# Patient Record
Sex: Female | Born: 1935
Health system: Southern US, Community
[De-identification: ages and names within clinical notes are randomized; demographics above are authoritative.]

## PROBLEM LIST (undated history)

## (undated) DIAGNOSIS — J309 Allergic rhinitis, unspecified: Secondary | ICD-10-CM

## (undated) DIAGNOSIS — M797 Fibromyalgia: Secondary | ICD-10-CM

## (undated) DIAGNOSIS — I5030 Unspecified diastolic (congestive) heart failure: Secondary | ICD-10-CM

## (undated) DIAGNOSIS — R7301 Impaired fasting glucose: Secondary | ICD-10-CM

## (undated) DIAGNOSIS — S2220XA Unspecified fracture of sternum, initial encounter for closed fracture: Secondary | ICD-10-CM

## (undated) DIAGNOSIS — D696 Thrombocytopenia, unspecified: Secondary | ICD-10-CM

## (undated) DIAGNOSIS — S2239XA Fracture of one rib, unspecified side, initial encounter for closed fracture: Secondary | ICD-10-CM

## (undated) DIAGNOSIS — I4510 Unspecified right bundle-branch block: Secondary | ICD-10-CM

## (undated) DIAGNOSIS — E785 Hyperlipidemia, unspecified: Secondary | ICD-10-CM

## (undated) DIAGNOSIS — IMO0002 Reserved for concepts with insufficient information to code with codable children: Secondary | ICD-10-CM

## (undated) DIAGNOSIS — I2510 Atherosclerotic heart disease of native coronary artery without angina pectoris: Secondary | ICD-10-CM

## (undated) HISTORY — PX: TUBAL LIGATION: SHX77

## (undated) HISTORY — PX: TONSILLECTOMY AND ADENOIDECTOMY: SUR1326

## (undated) HISTORY — PX: APPENDECTOMY: SHX54

## (undated) HISTORY — PX: CHOLECYSTECTOMY: SHX55

---

## 1999-02-14 ENCOUNTER — Other Ambulatory Visit: Admission: RE | Admit: 1999-02-14 | Discharge: 1999-02-14 | Payer: Self-pay | Admitting: Family Medicine

## 2001-02-11 ENCOUNTER — Other Ambulatory Visit: Admission: RE | Admit: 2001-02-11 | Discharge: 2001-02-11 | Payer: Self-pay | Admitting: Family Medicine

## 2012-10-11 ENCOUNTER — Emergency Department (HOSPITAL_COMMUNITY): Payer: Medicare PPO

## 2012-10-11 ENCOUNTER — Encounter (HOSPITAL_COMMUNITY): Payer: Self-pay | Admitting: Emergency Medicine

## 2012-10-11 ENCOUNTER — Inpatient Hospital Stay (HOSPITAL_COMMUNITY)
Admission: EM | Admit: 2012-10-11 | Discharge: 2012-10-16 | DRG: 287 | Disposition: A | Discharge status: Home / Self Care | Payer: Medicare PPO | Attending: Cardiology | Admitting: Cardiology

## 2012-10-11 DIAGNOSIS — Z79899 Other long term (current) drug therapy: Secondary | ICD-10-CM

## 2012-10-11 DIAGNOSIS — R001 Bradycardia, unspecified: Secondary | ICD-10-CM

## 2012-10-11 DIAGNOSIS — I208 Other forms of angina pectoris: Secondary | ICD-10-CM

## 2012-10-11 DIAGNOSIS — I2089 Other forms of angina pectoris: Secondary | ICD-10-CM

## 2012-10-11 DIAGNOSIS — I359 Nonrheumatic aortic valve disorder, unspecified: Secondary | ICD-10-CM | POA: Diagnosis present

## 2012-10-11 DIAGNOSIS — I2510 Atherosclerotic heart disease of native coronary artery without angina pectoris: Secondary | ICD-10-CM | POA: Diagnosis present

## 2012-10-11 DIAGNOSIS — J309 Allergic rhinitis, unspecified: Secondary | ICD-10-CM | POA: Diagnosis present

## 2012-10-11 DIAGNOSIS — IMO0001 Reserved for inherently not codable concepts without codable children: Secondary | ICD-10-CM | POA: Diagnosis present

## 2012-10-11 DIAGNOSIS — I4510 Unspecified right bundle-branch block: Secondary | ICD-10-CM | POA: Diagnosis present

## 2012-10-11 DIAGNOSIS — R7301 Impaired fasting glucose: Secondary | ICD-10-CM | POA: Diagnosis present

## 2012-10-11 DIAGNOSIS — I509 Heart failure, unspecified: Secondary | ICD-10-CM | POA: Diagnosis present

## 2012-10-11 DIAGNOSIS — E785 Hyperlipidemia, unspecified: Secondary | ICD-10-CM | POA: Diagnosis present

## 2012-10-11 DIAGNOSIS — I440 Atrioventricular block, first degree: Secondary | ICD-10-CM | POA: Diagnosis present

## 2012-10-11 DIAGNOSIS — R079 Chest pain, unspecified: Secondary | ICD-10-CM | POA: Diagnosis present

## 2012-10-11 DIAGNOSIS — Z7982 Long term (current) use of aspirin: Secondary | ICD-10-CM

## 2012-10-11 DIAGNOSIS — I495 Sick sinus syndrome: Secondary | ICD-10-CM | POA: Diagnosis present

## 2012-10-11 DIAGNOSIS — I5030 Unspecified diastolic (congestive) heart failure: Secondary | ICD-10-CM | POA: Diagnosis present

## 2012-10-11 DIAGNOSIS — D696 Thrombocytopenia, unspecified: Secondary | ICD-10-CM | POA: Diagnosis present

## 2012-10-11 DIAGNOSIS — I5031 Acute diastolic (congestive) heart failure: Principal | ICD-10-CM | POA: Diagnosis present

## 2012-10-11 DIAGNOSIS — I249 Acute ischemic heart disease, unspecified: Secondary | ICD-10-CM

## 2012-10-11 HISTORY — DX: Fibromyalgia: M79.7

## 2012-10-11 HISTORY — DX: Unspecified fracture of sternum, initial encounter for closed fracture: S22.20XA

## 2012-10-11 HISTORY — DX: Allergic rhinitis, unspecified: J30.9

## 2012-10-11 HISTORY — DX: Atherosclerotic heart disease of native coronary artery without angina pectoris: I25.10

## 2012-10-11 HISTORY — DX: Unspecified diastolic (congestive) heart failure: I50.30

## 2012-10-11 HISTORY — DX: Fracture of one rib, unspecified side, initial encounter for closed fracture: S22.39XA

## 2012-10-11 HISTORY — DX: Unspecified right bundle-branch block: I45.10

## 2012-10-11 HISTORY — DX: Hyperlipidemia, unspecified: E78.5

## 2012-10-11 HISTORY — DX: Impaired fasting glucose: R73.01

## 2012-10-11 HISTORY — DX: Thrombocytopenia, unspecified: D69.6

## 2012-10-11 HISTORY — DX: Reserved for concepts with insufficient information to code with codable children: IMO0002

## 2012-10-11 LAB — CBC
HCT: 41.2 % (ref 36.0–46.0)
MCV: 88.6 fL (ref 78.0–100.0)
RBC: 4.65 MIL/uL (ref 3.87–5.11)
RDW: 13.1 % (ref 11.5–15.5)
WBC: 5.5 10*3/uL (ref 4.0–10.5)

## 2012-10-11 LAB — COMPREHENSIVE METABOLIC PANEL
BUN: 20 mg/dL (ref 6–23)
CO2: 25 mEq/L (ref 19–32)
Chloride: 101 mEq/L (ref 96–112)
Creatinine, Ser: 0.8 mg/dL (ref 0.50–1.10)
GFR calc Af Amer: 78 mL/min — ABNORMAL LOW (ref >90)
GFR calc non Af Amer: 67 mL/min — ABNORMAL LOW (ref >90)
Total Bilirubin: 0.6 mg/dL (ref 0.3–1.2)

## 2012-10-11 LAB — URINALYSIS, ROUTINE W REFLEX MICROSCOPIC
Protein, ur: NEGATIVE mg/dL
Urobilinogen, UA: 1 mg/dL (ref 0.0–1.0)

## 2012-10-11 LAB — POCT I-STAT TROPONIN I: Troponin i, poc: 0 ng/mL (ref 0.00–0.08)

## 2012-10-11 LAB — URINE MICROSCOPIC-ADD ON

## 2012-10-11 MED ORDER — VITAMIN D3 25 MCG (1000 UNIT) PO TABS
1000.0000 [IU] | ORAL_TABLET | Freq: Every day | ORAL | Status: DC
  Administered 2012-10-12 – 2012-10-16 (×5): 1000 [IU] via ORAL
  Filled 2012-10-11 (×6): qty 1

## 2012-10-11 MED ORDER — NITROGLYCERIN 2 % TD OINT: 1.0000 [in_us] | TOPICAL_OINTMENT | Freq: Three times a day (TID) | TRANSDERMAL | Status: DC

## 2012-10-11 MED ORDER — TIZANIDINE HCL 4 MG PO TABS
4.0000 mg | ORAL_TABLET | Freq: Three times a day (TID) | ORAL | Status: DC | PRN
  Filled 2012-10-11: qty 1

## 2012-10-11 MED ORDER — SODIUM CHLORIDE 0.9 % IV SOLN: INTRAVENOUS | Status: DC

## 2012-10-11 MED ORDER — SODIUM CHLORIDE 0.9 % IJ SOLN: 3.0000 mL | INTRAMUSCULAR | Status: DC | PRN

## 2012-10-11 MED ORDER — HEPARIN (PORCINE) IN NACL 100-0.45 UNIT/ML-% IJ SOLN
950.0000 [IU]/h | INTRAMUSCULAR | Status: DC
  Administered 2012-10-11: 1000 [IU]/h via INTRAVENOUS
  Administered 2012-10-12 – 2012-10-14 (×3): 950 [IU]/h via INTRAVENOUS
  Filled 2012-10-11 (×5): qty 250

## 2012-10-11 MED ORDER — ALPRAZOLAM 0.25 MG PO TABS: 0.2500 mg | ORAL_TABLET | Freq: Two times a day (BID) | ORAL | Status: DC | PRN

## 2012-10-11 MED ORDER — ONDANSETRON HCL 4 MG/2ML IJ SOLN: 4.0000 mg | Freq: Four times a day (QID) | INTRAMUSCULAR | Status: DC | PRN

## 2012-10-11 MED ORDER — ZOLPIDEM TARTRATE 5 MG PO TABS
5.0000 mg | ORAL_TABLET | Freq: Every evening | ORAL | Status: DC | PRN
Start: 2012-10-11 — End: 2012-10-16

## 2012-10-11 MED ORDER — METOPROLOL TARTRATE 25 MG PO TABS
25.0000 mg | ORAL_TABLET | Freq: Two times a day (BID) | ORAL | Status: DC
  Administered 2012-10-11: 25 mg via ORAL
  Filled 2012-10-11 (×3): qty 1

## 2012-10-11 MED ORDER — SODIUM CHLORIDE 0.9 % IJ SOLN: 3.0000 mL | Freq: Two times a day (BID) | INTRAMUSCULAR | Status: DC

## 2012-10-11 MED ORDER — ADULT MULTIVITAMIN W/MINERALS CH
1.0000 | ORAL_TABLET | Freq: Every day | ORAL | Status: DC
  Administered 2012-10-12 – 2012-10-16 (×5): 1 via ORAL
  Filled 2012-10-11 (×6): qty 1

## 2012-10-11 MED ORDER — NIFEDIPINE ER 30 MG PO TB24
30.0000 mg | ORAL_TABLET | Freq: Every day | ORAL | Status: DC
  Filled 2012-10-11 (×2): qty 1

## 2012-10-11 MED ORDER — NITROGLYCERIN 0.4 MG SL SUBL: 0.4000 mg | SUBLINGUAL_TABLET | SUBLINGUAL | Status: DC | PRN

## 2012-10-11 MED ORDER — ACETAMINOPHEN 325 MG PO TABS: 650.0000 mg | ORAL_TABLET | ORAL | Status: DC | PRN

## 2012-10-11 MED ORDER — VENLAFAXINE HCL ER 150 MG PO CP24
150.0000 mg | ORAL_CAPSULE | Freq: Every day | ORAL | Status: DC
  Administered 2012-10-11 – 2012-10-16 (×6): 150 mg via ORAL
  Filled 2012-10-11 (×7): qty 1

## 2012-10-11 MED ORDER — HEPARIN BOLUS VIA INFUSION
4000.0000 [IU] | Freq: Once | INTRAVENOUS | Status: AC
  Administered 2012-10-11: 4000 [IU] via INTRAVENOUS

## 2012-10-11 MED ORDER — ASPIRIN 81 MG PO CHEW
81.0000 mg | CHEWABLE_TABLET | Freq: Once | ORAL | Status: DC
  Filled 2012-10-11: qty 1

## 2012-10-11 MED ORDER — SODIUM CHLORIDE 0.9 % IV SOLN
250.0000 mL | INTRAVENOUS | Status: DC | PRN
  Administered 2012-10-13 – 2012-10-14 (×2): 250 mL via INTRAVENOUS

## 2012-10-11 NOTE — H&P (Signed)
CARDIOLOGY ADMISSION NOTE  Patient ID: Katherine Long MRN: [id number] DOB/AGE: 77-Aug-1930 77 y.o.  Admit date: 10/11/2012 Primary Physician   PROCHNAU,CAROLINE, MD Primary Cardiologist   None Chief Complaint    Chest pain  HPI:  The patient presents for evaluation of chest pain. He has no prior cardiac history other then palpitations for which he has worn monitor in the past.   She apparently had a stress test in the past as well as normal.  She presents with a one-week history of chest discomfort. This is occurring with exertion. She walks short distance and she will begin half dyspnea followed by chest heaviness. This has been severe. With this she will get nauseated and have some diaphoresis. She's not had any jaw discomfort. She has to stop what she is doing and it'll go away in 5-10 minutes. Because this was consistent happening with any activity this week she finally presented for evaluation. She has never had this before. She is not having any resting symptoms such as shortness of breath, PND or orthopnea. She's not noticing any palpitations, presyncope or syncope. She otherwise has been active.    Past Medical History  Diagnosis Date  . Fibromyalgia   . Hyperlipidemia   . Impaired fasting glucose   . Sternal fracture   . Vertebral fracture, closed   . Rib fracture   . Allergic rhinitis     Past Surgical History  Procedure Laterality Date  . Appendectomy    . Cholecystectomy    . Tubal ligation    . Tonsillectomy and adenoidectomy      Allergies  Allergen Reactions  . Penicillins    Prior to Admission medications   Medication Sig Start Date End Date Taking? Authorizing Provider  aspirin 81 MG tablet Take 324 mg by mouth once.   Yes Historical Provider, MD  cholecalciferol (VITAMIN D) 1000 UNITS tablet Take 1,000 Units by mouth daily.   Yes Historical Provider, MD  Multiple Vitamin (MULTIVITAMIN WITH MINERALS) TABS Take 1 tablet by mouth daily.   Yes Historical  Provider, MD  NIFEdipine (PROCARDIA-XL/ADALAT CC) 30 MG 24 hr tablet Take 30 mg by mouth daily.   Yes Historical Provider, MD  oxymetazoline (AFRIN) 0.05 % nasal spray Place 2 sprays into the nose 2 (two) times daily.   Yes Historical Provider, MD  tiZANidine (ZANAFLEX) 4 MG tablet Take 4 mg by mouth every 8 (eight) hours as needed (for muscle pain).   Yes Historical Provider, MD  venlafaxine XR (EFFEXOR-XR) 150 MG 24 hr capsule Take 150 mg by mouth daily.   Yes Historical Provider, MD     History   Social History  . Marital Status: Married    Spouse Name: N/A    Number of Children: 2  . Years of Education: N/A   Occupational History  . Not on file.   Social History Main Topics  . Smoking status: Never Smoker   . Smokeless tobacco: Not on file  . Alcohol Use: No  . Drug Use: No  . Sexually Active: Not on file   Other Topics Concern  . Not on file   Social History Narrative   Lives with husband.            Family History  Problem Relation Age of Onset  . Arrhythmia Mother   . CAD Father 26    Died age 53  . CAD Brother 51  . Cancer Brother     Lung  . Cancer Mother  Kidney    ROS:  As stated in the HPI and negative for all other systems.  Physical Exam: Blood pressure 126/68, pulse 78, temperature 98.2 F (36.8 C), temperature source Oral, resp. rate 16, height 5\' 4"  (1.626 m), weight 167 lb (75.751 kg), SpO2 95.00%.  GENERAL:  Well appearing HEENT:  Pupils equal round and reactive, fundi not visualized, oral mucosa unremarkable NECK:  No jugular venous distention, waveform within normal limits, carotid upstroke brisk and symmetric, no bruits, no thyromegaly LYMPHATICS:  No cervical, inguinal adenopathy LUNGS:  Clear to auscultation bilaterally BACK:  No CVA tenderness CHEST:  Unremarkable HEART:  PMI not displaced or sustained,S1 and S2 within normal limits, no S3, no S4, no clicks, no rubs, right upper sternal border brief apical systolic, diastolic  murmurs ABD:  Flat, positive bowel sounds normal in frequency in pitch, no bruits, no rebound, no guarding, no midline pulsatile mass, no hepatomegaly, no splenomegaly EXT:  2 plus pulses throughout, no edema, no cyanosis no clubbing SKIN:  No rashes no nodules NEURO:  Cranial nerves II through XII grossly intact, motor grossly intact throughout PSYCH:  Cognitively intact, oriented to person place and time  Labs: Lab Results  Component Value Date   BUN 20 10/11/2012   Lab Results  Component Value Date   CREATININE 0.80 10/11/2012   Lab Results  Component Value Date   NA 138 10/11/2012   K 4.0 10/11/2012   CL 101 10/11/2012   CO2 25 10/11/2012   No results found for this basename: CKTOTAL,  CKMB,  CKMBINDEX,  TROPONINI   Lab Results  Component Value Date   WBC 5.5 10/11/2012   HGB 13.7 10/11/2012   HCT 41.2 10/11/2012   MCV 88.6 10/11/2012   PLT 125* 10/11/2012   No results found for this basename: CHOL,  HDL,  LDLCALC,  LDLDIRECT,  TRIG,  CHOLHDL   Lab Results  Component Value Date   ALT 14 10/11/2012   AST 18 10/11/2012   ALKPHOS 102 10/11/2012   BILITOT 0.6 10/11/2012     Radiology:  CXR:  Cardiomegaly. No acute cardiopulmonary disease. Mild changes of  what is likely chronic bronchitis and/or asthma.  EKG:  NSR, RBBB, rate 68, first degree AV block.  No acute ST T wave changes. 10/11/2012  ASSESSMENT AND PLAN:    Chest pain:  This is consistent with new onset Class III - IV angina.  She will need to be admitted to the hospital. We will cycle cardiac enzymes. She'll be treated with topical nitrates, aspirin and IV heparin. I will start a low-dose beta blocker. She will need cardiac catheterization. The patient understands that risks included but are not limited to stroke (1 in 1000), death (1 in 1000), kidney failure [usually temporary] (1 in 500), bleeding (1 in 200), allergic reaction [possibly serious] (1 in 200).  The patient understands and agrees to proceed.   RBBB:  I  have no old EKG for comparison. Evaluation is as above. She has had no symptomatic bradycardia arrhythmias.  Dyslipidemia:  This has been mild and diet controlled. We will check a lipid profile in the morning.  Hyperglycemia:  I will check a hemoglobin A1c.  Murmur:  I will check an echocardiogram  Signed: Rollene Rotunda 10/11/2012, 4:18 PM

## 2012-10-11 NOTE — ED Notes (Signed)
Pt c/o mid chest pain with shortness of breath, nausea, diaphoresis and dizziness. Pt reports pain with exertion and relief with rest. Pt was seen at urgent care and sent here for further eval. Pt was given 4 baby asa.

## 2012-10-11 NOTE — ED Provider Notes (Signed)
79 y.o. Female with exertional chest pain for a two days.  Associated with dyspnea, diaphoresis, and lightheaded.Marland Kitchen  Resolves with rest. No vomiting, fever, cough, slight headache.    HR 73, hr 74, bp 129/89  Lungs cta, abdomen soft nontender Heart- regular rate and rhythm, no murmurs or gallops,  Extremities with good pulses, no edema.  Exertional chest pain for two days, abnormal ekg.  Plan admission for further cardiac evaluation.  Results for orders placed during the hospital encounter of 10/11/12  PRO B NATRIURETIC PEPTIDE      Result Value Range   Pro B Natriuretic peptide (BNP) 105.1  0 - 450 pg/mL  CBC      Result Value Range   WBC 5.5  4.0 - 10.5 K/uL   RBC 4.65  3.87 - 5.11 MIL/uL   Hemoglobin 13.7  12.0 - 15.0 g/dL   HCT 16.1  09.6 - 04.5 %   MCV 88.6  78.0 - 100.0 fL   MCH 29.5  26.0 - 34.0 pg   MCHC 33.3  30.0 - 36.0 g/dL   RDW 40.9  81.1 - 91.4 %   Platelets 125 (*) 150 - 400 K/uL  COMPREHENSIVE METABOLIC PANEL      Result Value Range   Sodium 138  135 - 145 mEq/L   Potassium 4.0  3.5 - 5.1 mEq/L   Chloride 101  96 - 112 mEq/L   CO2 25  19 - 32 mEq/L   Glucose, Bld 91  70 - 99 mg/dL   BUN 20  6 - 23 mg/dL   Creatinine, Ser 7.82  0.50 - 1.10 mg/dL   Calcium 9.5  8.4 - 95.6 mg/dL   Total Protein 7.0  6.0 - 8.3 g/dL   Albumin 3.6  3.5 - 5.2 g/dL   AST 18  0 - 37 U/L   ALT 14  0 - 35 U/L   Alkaline Phosphatase 102  39 - 117 U/L   Total Bilirubin 0.6  0.3 - 1.2 mg/dL   GFR calc non Af Amer 67 (*) >90 mL/min   GFR calc Af Amer 78 (*) >90 mL/min  POCT I-STAT TROPONIN I      Result Value Range   Troponin i, poc 0.00  0.00 - 0.08 ng/mL   Comment 3             I performed a history and physical examination of Rosebud Poles and discussed her management with Dr. Berline Chough.  I agree with the history, physical, assessment, and plan of care, with the following exceptions: None  I was present for the following procedures: None Time Spent in Critical Care of the patient:  None Time spent in discussions with the patient and family: 77  Shandel Busic S    Hilario Quarry, MD 10/11/12 1401

## 2012-10-11 NOTE — ED Provider Notes (Signed)
History    CSN: [id number] Arrival date & time 10/11/12  1111  None    Chief Complaint  Patient presents with  . Chest Pain   HPI Comments: Patient presents to the emergency department upon request of her primary care physician who she saw earlier today.  She reports a three-day onset of progressive chest pressure and chest pain with exertion.  She reports diaphoresis and nausea when she exerts herself.  She has no pain at rest.she describes the discomfort as a chest pressure that progresses into a left-sided chest pain (nonradiating) if she continues to walk.  She reports she has had this on occasion over the past couple of years and has seen a cardiologist (in [place], [place]) for this and and was told everything was normal.  She was given 324 mg aspirin at her PCPs office.  Has not received any nitroglycerin.  Denies any pain at this time.  She has no history of coronary artery diseaseor hypertension.  She does report diet controlled hyperlipidemia and impaired fasting glucose.  She has never been a smoker.  She does have a positive family history for MI in her mother but not at an early age.  Currently lives independently.  Patient is a 79 y.o. female presenting with chest pain.  Chest Pain Associated symptoms: diaphoresis, fatigue, nausea and shortness of breath   Associated symptoms: no abdominal pain, no cough, no dizziness, no fever, no headache, no palpitations, not vomiting and no weakness     Past Medical History  Diagnosis Date  . Fibromyalgia   . Hyperlipidemia     diet controlled  . Impaired fasting glucose     diet controlled  . Sternal fracture     due to fall off porch  . Vertebral fracture, closed     due to fall off porch  . Rib fracture     due to fall off porch  . Allergic rhinitis     Past Surgical History  Procedure Laterality Date  . Appendectomy    . Cholecystectomy    . Tubal ligation      Family History  Problem Relation Age of Onset  . Heart attack  Mother     History  Substance Use Topics  . Smoking status: Never Smoker   . Smokeless tobacco: Not on file  . Alcohol Use: No    OB History   Grav Para Term Preterm Abortions TAB SAB Ect Mult Living                  Review of Systems  Constitutional: Positive for diaphoresis, activity change and fatigue. Negative for fever, chills, appetite change and unexpected weight change.  HENT: Negative.  Negative for neck pain.   Eyes: Negative.  Negative for visual disturbance.  Respiratory: Positive for chest tightness and shortness of breath. Negative for cough, choking, wheezing and stridor.   Cardiovascular: Positive for chest pain. Negative for palpitations and leg swelling.  Gastrointestinal: Positive for nausea. Negative for vomiting, abdominal pain, diarrhea, blood in stool and abdominal distention.  Genitourinary: Negative for dysuria, frequency, hematuria and difficulty urinating.  Musculoskeletal: Positive for arthralgias (at baseline, Fibromyalgia).  Skin: Negative for color change, pallor, rash and wound.  Allergic/Immunologic: Negative for environmental allergies and immunocompromised state.  Neurological: Positive for light-headedness (occasionally with chest pressure). Negative for dizziness, syncope, speech difficulty, weakness and headaches.  Hematological: Negative.   Psychiatric/Behavioral: Negative.     Allergies  Penicillins  Home Medications   Current Outpatient  Rx  Name  Route  Sig  Dispense  Refill  . aspirin 81 MG tablet   Oral   Take 324 mg by mouth once.         . cholecalciferol (VITAMIN D) 1000 UNITS tablet   Oral   Take 1,000 Units by mouth daily.         . Multiple Vitamin (MULTIVITAMIN WITH MINERALS) TABS   Oral   Take 1 tablet by mouth daily.         Marland Kitchen NIFEdipine (PROCARDIA-XL/ADALAT CC) 30 MG 24 hr tablet   Oral   Take 30 mg by mouth daily.         Marland Kitchen oxymetazoline (AFRIN) 0.05 % nasal spray   Nasal   Place 2 sprays into the  nose 2 (two) times daily.         Marland Kitchen tiZANidine (ZANAFLEX) 4 MG tablet   Oral   Take 4 mg by mouth every 8 (eight) hours as needed (for muscle pain).         Marland Kitchen venlafaxine XR (EFFEXOR-XR) 150 MG 24 hr capsule   Oral   Take 150 mg by mouth daily.           BP 140/82  Pulse 83  Temp(Src) 98.2 F (36.8 C) (Oral)  Resp 16  Ht 5\' 4"  (1.626 m)  Wt 167 lb (75.751 kg)  BMI 28.65 kg/m2  SpO2 96%  Physical Exam  Nursing note and vitals reviewed. Constitutional: She appears well-developed and well-nourished. No distress.  HENT:  Head: Normocephalic and atraumatic.  Eyes: Conjunctivae are normal. Right eye exhibits no discharge. Left eye exhibits no discharge. No scleral icterus.  Neck: No JVD present. No tracheal deviation present.  Cardiovascular: Normal rate, regular rhythm, S1 normal and S2 normal.  Exam reveals distant heart sounds. Exam reveals no gallop.   No murmur heard. Pulses:      Radial pulses are 1+ on the right side, and 1+ on the left side.  Trace lower extremity distal pulses.  LE Capillary refill 3-4 seconds.  Pulmonary/Chest: Effort normal and breath sounds normal. No respiratory distress. She has no wheezes. She has no rales.  Abdominal: Soft. She exhibits no distension. There is no tenderness.  Musculoskeletal: Normal range of motion. She exhibits no edema.  Neurological: She is alert. She exhibits normal muscle tone.  Skin: Skin is warm and dry. No rash noted. She is not diaphoretic. No erythema. No pallor.  Psychiatric: She has a normal mood and affect. Her behavior is normal. Judgment and thought content normal.    ED Course  Procedures (including critical care time)  Labs Reviewed  CBC - Abnormal; Notable for the following:    Platelets 125 (*)    All other components within normal limits  COMPREHENSIVE METABOLIC PANEL - Abnormal; Notable for the following:    GFR calc non Af Amer 67 (*)    GFR calc Af Amer 78 (*)    All other components within  normal limits  PRO B NATRIURETIC PEPTIDE  URINALYSIS, ROUTINE W REFLEX MICROSCOPIC  POCT I-STAT TROPONIN I   Dg Chest 2 View  10/11/2012   *RADIOLOGY REPORT*  Clinical Data: Chest pain.  Arrhythmia.  CHEST - 2 VIEW  Comparison: None.  Findings: Cardiac silhouette moderately enlarged.  Thoracic aorta tortuous and atherosclerotic.  Hilar and mediastinal contours otherwise unremarkable.  Mildly prominent bronchovascular markings diffusely and mild central peribronchial thickening.  Lungs otherwise clear.  Normal pulmonary vascularity.  No pneumothorax. No  pleural effusions.  Degenerative changes involving the thoracic spine and multiple thoracic vertebral compression fractures due to osteopenia.  Thoracolumbar scoliosis convex left.  IMPRESSION: Cardiomegaly.  No acute cardiopulmonary disease.  Mild changes of what is likely chronic bronchitis and/or asthma.   Original Report Authenticated By: Hulan Saas, M.D.      Date: 10/11/2012  Rate: 89  Rhythm: normal sinus rhythm  QRS Axis: normal  Intervals: prolonged PR interval at 250,   ST/T Wave abnormalities: T wave inversion in anterior/inferior leads  Conduction Disutrbances: RBBB  Narrative Interpretation: Abnormal EKG, no STEMI  No OLD EKG.    No diagnosis found.   MDM  1150 - patient with symptoms concerning for acutely, worsening exertional angina = unstable angina.  Denies any chest pain at this time what is reproducible with any type of exertion.  Patient has received 324 mg of aspirin.  Has evidence of right bundle branch block and first degree AV block on EKG evidence for inferior lateral ischemia with T wave inversions in V1 -V4, Lead III and aVL.  No ST changes.  No prior EKG available at this time.  ACS protocol and plan to have cardiology see.     1400 - unremarkable labs and chest x-ray.  Will contact [place] cardiology for admission to evaluate for ACS evaluation.  No active chest pain at this time.       Andrena Mews, DO 10/11/12 1414

## 2012-10-11 NOTE — ED Provider Notes (Signed)
  I performed a history and physical examination of Katherine Long and discussed her management with Dr. Berline Chough.  I agree with the history, physical, assessment, and plan of care, with the following exceptions: None  I was present for the following procedures: None Time Spent in Critical Care of the patient: None Time spent in discussions with the patient and family: 58  Bayler Nehring S    Hilario Quarry, MD 10/11/12 [phone]

## 2012-10-11 NOTE — Progress Notes (Signed)
ANTICOAGULATION CONSULT NOTE - Initial Consult  Pharmacy Consult for UFH Indication: USAP  Allergies  Allergen Reactions  . Penicillins     Patient Measurements: Height: 5\' 4"  (162.6 cm) Weight: 167 lb (75.751 kg) IBW/kg (Calculated) : 54.7 Heparin Dosing Weight: 69kg  Vital Signs: Temp: 98.2 F (36.8 C) (05/23 1120) Temp src: Oral (05/23 1120) BP: 126/68 mmHg (05/23 1500) Pulse Rate: 78 (05/23 1500)  Labs:  Recent Labs  10/11/12 1215  HGB 13.7  HCT 41.2  PLT 125*  CREATININE 0.80    Estimated Creatinine Clearance: 54.9 ml/min (by C-G formula based on Cr of 0.8).   Medical History: Past Medical History  Diagnosis Date  . Fibromyalgia   . Hyperlipidemia   . Impaired fasting glucose   . Sternal fracture   . Vertebral fracture, closed   . Rib fracture   . Allergic rhinitis     Medications:   (Not in a hospital admission)  Assessment: 79 y/o female patient admitted with chest pain requiring anticoagulation for r/o MI. Noted baseline thrombocytopenia. Trop negative, EKG with T wave inversion.  Goal of Therapy:  Heparin level 0.3-0.7 units/ml Monitor platelets by anticoagulation protocol: Yes   Plan:  Heparin 4000 unit IV bolus followed by infusion at 1000 units/hr. Check 8 hour heparin level with daily cbc and heparin level.  Verlene Mayer, PharmD, BCPS Pager [phone]/23/2014,4:16 PM

## 2012-10-12 DIAGNOSIS — I209 Angina pectoris, unspecified: Secondary | ICD-10-CM

## 2012-10-12 LAB — CBC
Hemoglobin: 11.6 g/dL — ABNORMAL LOW (ref 12.0–15.0)
MCHC: 33 g/dL (ref 30.0–36.0)
Platelets: 100 10*3/uL — ABNORMAL LOW (ref 150–400)
RDW: 13.1 % (ref 11.5–15.5)

## 2012-10-12 LAB — HEMOGLOBIN A1C: Mean Plasma Glucose: 114 mg/dL (ref <117)

## 2012-10-12 LAB — LIPID PANEL
LDL Cholesterol: 86 mg/dL (ref 0–99)
Total CHOL/HDL Ratio: 2.4 RATIO
VLDL: 12 mg/dL (ref 0–40)

## 2012-10-12 LAB — HEPARIN LEVEL (UNFRACTIONATED)
Heparin Unfractionated: 0.57 IU/mL (ref 0.30–0.70)
Heparin Unfractionated: 0.61 IU/mL (ref 0.30–0.70)

## 2012-10-12 LAB — COMPREHENSIVE METABOLIC PANEL
ALT: 12 U/L (ref 0–35)
CO2: 28 mEq/L (ref 19–32)
Calcium: 8.9 mg/dL (ref 8.4–10.5)
GFR calc Af Amer: 71 mL/min — ABNORMAL LOW (ref >90)
GFR calc non Af Amer: 62 mL/min — ABNORMAL LOW (ref >90)
Glucose, Bld: 105 mg/dL — ABNORMAL HIGH (ref 70–99)
Sodium: 143 mEq/L (ref 135–145)
Total Bilirubin: 0.5 mg/dL (ref 0.3–1.2)

## 2012-10-12 MED ORDER — SENNOSIDES-DOCUSATE SODIUM 8.6-50 MG PO TABS
1.0000 | ORAL_TABLET | Freq: Two times a day (BID) | ORAL | Status: DC | PRN
  Administered 2012-10-12: 1 via ORAL
  Filled 2012-10-12 (×4): qty 1

## 2012-10-12 MED ORDER — ISOSORBIDE DINITRATE 5 MG PO TABS
5.0000 mg | ORAL_TABLET | Freq: Three times a day (TID) | ORAL | Status: DC
  Administered 2012-10-12 – 2012-10-15 (×10): 5 mg via ORAL
  Filled 2012-10-12 (×15): qty 1

## 2012-10-12 NOTE — Progress Notes (Signed)
Patient's heart rate down to 38, sustaining in 40's all night.  Heart rate on admission 70-80's.  Patient denies any lightheadiness or dizziness.  Started on PO lopressor and dose given last pm.  Dr. Eden Emms on floor and made aware.  Will hold am lopressor and procardia.  Will continue to monitor.  Katherine Long

## 2012-10-12 NOTE — Progress Notes (Signed)
ANTICOAGULATION CONSULT NOTE - Follow Up Consult  Pharmacy Consult for heparin  Indication: [place]  Allergies  Allergen Reactions  . Penicillins     Patient Measurements: Height: 5\' 4"  (162.6 cm) Weight: 163 lb 3.2 oz (74.027 kg) IBW/kg (Calculated) : 54.7 Heparin Dosing Weight:   Vital Signs: Temp: 98.4 F (36.9 C) (05/23 2036) Temp src: Oral (05/23 2036) BP: 121/72 mmHg (05/23 2036) Pulse Rate: 75 (05/23 2036)  Labs:  Recent Labs  10/11/12 1215 10/11/12 1917 10/12/12 0025  HGB 13.7  --   --   HCT 41.2  --   --   PLT 125*  --   --   HEPARINUNFRC  --   --  0.57  CREATININE 0.80  --   --   TROPONINI  --  <0.30 <0.30    Estimated Creatinine Clearance: 54.3 ml/min (by C-G formula based on Cr of 0.8).     Assessment and Plan: 1st HL is therapeutic at 0.57 with no noted bleeding. Continue at current rate of 1000units/hr and f/u am labs Goal of Therapy:  Heparin level 0.3-0.7 units/ml Monitor platelets by anticoagulation protocol: Yes    Janice Coffin 10/12/2012,1:10 AM

## 2012-10-12 NOTE — Progress Notes (Signed)
Patient ID: Katherine Long, female   DOB: [birth year], 79 y.o.   MRN: [id number]    Subjective:  Denies SSCP, palpitations or Dyspnea Daughter indicates likely SSS Seen by [facility] cardiology before  Objective:  Filed Vitals:   10/11/12 1700 10/11/12 1731 10/11/12 2036 10/12/12 0436  BP: 128/66 130/60 121/72 134/76  Pulse: 82 84 75 84  Temp:  97.2 F (36.2 C) 98.4 F (36.9 C) 97.7 F (36.5 C)  TempSrc:   Oral Oral  Resp: 19 18 18 18   Height:  5\' 4"  (1.626 m)    Weight:  163 lb 3.2 oz (74.027 kg)  173 lb 3.2 oz (78.563 kg)  SpO2: 97% 98% 96% 95%    Intake/Output from previous day:  Intake/Output Summary (Last 24 hours) at 10/12/12 0847 Last data filed at 10/12/12 4098  Gross per 24 hour  Intake    480 ml  Output      0 ml  Net    480 ml    Physical Exam: Affect appropriate Healthy:  appears stated age HEENT: normal Neck supple with no adenopathy JVP normal no bruits no thyromegaly Lungs clear with no wheezing and good diaphragmatic motion Heart:  S1/S57midl AS murmur, no rub, gallop or click PMI normal Abdomen: benighn, BS positve, no tenderness, no AAA no bruit.  No HSM or HJR Distal pulses intact with no bruits No edema Neuro non-focal Skin warm and dry No muscular weakness   Lab Results: Basic Metabolic Panel:  Recent Labs  11/91/47 1215 10/12/12 0535  NA 138 143  K 4.0 4.9  CL 101 107  CO2 25 28  GLUCOSE 91 105*  BUN 20 19  CREATININE 0.80 0.86  CALCIUM 9.5 8.9   Liver Function Tests:  Recent Labs  10/11/12 1215 10/12/12 0535  AST 18 15  ALT 14 12  ALKPHOS 102 81  BILITOT 0.6 0.5  PROT 7.0 5.7*  ALBUMIN 3.6 3.0*   No results found for this basename: LIPASE, AMYLASE,  in the last 72 hours CBC:  Recent Labs  10/11/12 1215 10/12/12 0535  WBC 5.5 5.0  HGB 13.7 11.6*  HCT 41.2 35.2*  MCV 88.6 89.3  PLT 125* 100*   Cardiac Enzymes:  Recent Labs  10/11/12 1917 10/12/12 0025 10/12/12 0535  TROPONINI <0.30 <0.30 <0.30    Fasting Lipid Panel:  Recent Labs  10/12/12 0535  CHOL 170  HDL 72  LDLCALC 86  TRIG 58  CHOLHDL 2.4    Imaging: Dg Chest 2 View  10/11/2012   *RADIOLOGY REPORT*  Clinical Data: Chest pain.  Arrhythmia.  CHEST - 2 VIEW  Comparison: None.  Findings: Cardiac silhouette moderately enlarged.  Thoracic aorta tortuous and atherosclerotic.  Hilar and mediastinal contours otherwise unremarkable.  Mildly prominent bronchovascular markings diffusely and mild central peribronchial thickening.  Lungs otherwise clear.  Normal pulmonary vascularity.  No pneumothorax. No pleural effusions.  Degenerative changes involving the thoracic spine and multiple thoracic vertebral compression fractures due to osteopenia.  Thoracolumbar scoliosis convex left.  IMPRESSION: Cardiomegaly.  No acute cardiopulmonary disease.  Mild changes of what is likely chronic bronchitis and/or asthma.   Original Report Authenticated By: Hulan Saas, M.D.    Cardiac Studies:  ECG:  SR long PR RBBB   Telemetry:  SB rates as low as 47 no AV block  Echo:   Medications:   . aspirin  81 mg Oral Once  . cholecalciferol  1,000 Units Oral Daily  . isosorbide dinitrate  5 mg Oral TID  .  multivitamin with minerals  1 tablet Oral Daily  . nitroGLYCERIN  1 inch Topical Q8H  . sodium chloride  3 mL Intravenous Q12H  . venlafaxine XR  150 mg Oral Daily     . heparin 1,000 Units/hr (10/11/12 1638)    Assessment/Plan:  Chest Pain: See note by [facility]  Cath on Tuesday. Enzymes negative. On heparin will add nitrates.  Cannot take Beta blockers due to apparent SSS will stop loprssor and continue telemetry Murmur:  Echo appears to have mild AS  Patient wants laxative  Charlton Haws 10/12/2012, 8:47 AM

## 2012-10-12 NOTE — Plan of Care (Signed)
Problem: Phase I Progression Outcomes Goal: Aspirin unless contraindicated Outcome: Completed/Met Date Met:  10/12/12 Received 324 mg of ASA at primary MD office.

## 2012-10-12 NOTE — Progress Notes (Signed)
ANTICOAGULATION CONSULT NOTE - Follow Up Consult  Pharmacy Consult for heparin  Indication: [place]  Allergies  Allergen Reactions  . Penicillins     Patient Measurements: Height: 5\' 4"  (162.6 cm) Weight: 173 lb 3.2 oz (78.563 kg) IBW/kg (Calculated) : 54.7 Heparin Dosing Weight:   Vital Signs: Temp: 97.7 F (36.5 C) (05/24 0436) Temp src: Oral (05/24 0436) BP: 134/76 mmHg (05/24 0436) Pulse Rate: 84 (05/24 0436)  Labs:  Recent Labs  10/11/12 1215 10/11/12 1917 10/12/12 0025 10/12/12 0535  HGB 13.7  --   --  11.6*  HCT 41.2  --   --  35.2*  PLT 125*  --   --  100*  HEPARINUNFRC  --   --  0.57 0.61  CREATININE 0.80  --   --  0.86  TROPONINI  --  <0.30 <0.30 <0.30    Estimated Creatinine Clearance: 52.1 ml/min (by C-G formula based on Cr of 0.86).     Assessment and Plan: 83yof with no cardiac Hx except for remote palpitations,  presented with 1week Hx CP with exertion.  Tp negative.  Heparin drip 1000 uts/hr started 5/23 HL at goal 0.57> 0.61.  Slight trend up overnight with no rate change.  CBC stable but thrombocytopenia on admit.  Will decrease heparin drip rate to prevent accumulation in this elderly pt.   Goal of Therapy:  Heparin level 0.3-0.7 units/ml Monitor platelets by anticoagulation protocol: Yes    Plan: Decrease heparin drip 950 uts/hr Daily HL/CBC  Leota Sauers Pharm.D. CPP, BCPS Clinical Pharmacist [phone] 10/12/2012 8:04 AM

## 2012-10-13 LAB — CBC
MCHC: 32.3 g/dL (ref 30.0–36.0)
Platelets: 99 10*3/uL — ABNORMAL LOW (ref 150–400)
RDW: 13.1 % (ref 11.5–15.5)
WBC: 4.1 10*3/uL (ref 4.0–10.5)

## 2012-10-13 LAB — URINE CULTURE

## 2012-10-13 LAB — HEPARIN LEVEL (UNFRACTIONATED): Heparin Unfractionated: 0.6 IU/mL (ref 0.30–0.70)

## 2012-10-13 NOTE — Progress Notes (Signed)
ANTICOAGULATION CONSULT NOTE - Follow Up Consult  Pharmacy Consult for heparin  Indication: [place]  Allergies  Allergen Reactions  . Penicillins     Patient Measurements: Height: 5\' 4"  (162.6 cm) Weight: 171 lb 1.2 oz (77.6 kg) IBW/kg (Calculated) : 54.7 Heparin Dosing Weight:   Vital Signs: Temp: 98 F (36.7 C) (05/25 0630) Temp src: Oral (05/25 0630) BP: 109/50 mmHg (05/25 0630) Pulse Rate: 66 (05/25 0630)  Labs:  Recent Labs  10/11/12 1215 10/11/12 1917 10/12/12 0025 10/12/12 0535 10/13/12 0540  HGB 13.7  --   --  11.6* 11.5*  HCT 41.2  --   --  35.2* 35.6*  PLT 125*  --   --  100* 99*  HEPARINUNFRC  --   --  0.57 0.61 0.60  CREATININE 0.80  --   --  0.86  --   TROPONINI  --  <0.30 <0.30 <0.30  --     Estimated Creatinine Clearance: 51.8 ml/min (by C-G formula based on Cr of 0.86).     Assessment and Plan: 83yof with no cardiac Hx except for remote palpitations,  presented with 1week Hx CP with exertion.  Tp negative.  Heparin drip 950 uts/hr heparin level 0.6.  H/H  stable low pltc -but thrombocytopenia on admit.   Goal of Therapy:  Heparin level 0.3-0.7 units/ml Monitor platelets by anticoagulation protocol: Yes    Plan: Decrease heparin drip 950 uts/hr Daily HL/CBC  Leota Sauers Pharm.D. CPP, BCPS Clinical Pharmacist [phone] 10/13/2012 8:24 AM

## 2012-10-13 NOTE — Progress Notes (Signed)
Patient ID: Katherine Long, female   DOB: 18-Mar-1929, 79 y.o.   MRN: [id number]    Subjective:  Denies SSCP, palpitations or Dyspnea Daughter indicates likely SSS Seen by [facility] before  HR back up after beta blocker stopped  Objective:  Filed Vitals:   10/12/12 0436 10/12/12 1400 10/12/12 2141 10/13/12 0630  BP: 134/76 107/58 98/54 109/50  Pulse: 84 49 50 66  Temp: 97.7 F (36.5 C) 98.4 F (36.9 C) 98.5 F (36.9 C) 98 F (36.7 C)  TempSrc: Oral Oral Oral Oral  Resp: 18 18 18 18   Height:      Weight: 173 lb 3.2 oz (78.563 kg)   171 lb 1.2 oz (77.6 kg)  SpO2: 95% 96% 96% 96%    Intake/Output from previous day:  Intake/Output Summary (Last 24 hours) at 10/13/12 1660 Last data filed at 10/12/12 1900  Gross per 24 hour  Intake   1071 ml  Output      0 ml  Net   1071 ml    Physical Exam: Affect appropriate Healthy:  appears stated age HEENT: normal Neck supple with no adenopathy JVP normal no bruits no thyromegaly Lungs clear with no wheezing and good diaphragmatic motion Heart:  S1/S34midl AS murmur, no rub, gallop or click PMI normal Abdomen: benighn, BS positve, no tenderness, no AAA no bruit.  No HSM or HJR Distal pulses intact with no bruits No edema Neuro non-focal Skin warm and dry No muscular weakness   Lab Results: Basic Metabolic Panel:  Recent Labs  63/01/60 1215 10/12/12 0535  NA 138 143  K 4.0 4.9  CL 101 107  CO2 25 28  GLUCOSE 91 105*  BUN 20 19  CREATININE 0.80 0.86  CALCIUM 9.5 8.9   Liver Function Tests:  Recent Labs  10/11/12 1215 10/12/12 0535  AST 18 15  ALT 14 12  ALKPHOS 102 81  BILITOT 0.6 0.5  PROT 7.0 5.7*  ALBUMIN 3.6 3.0*   No results found for this basename: LIPASE, AMYLASE,  in the last 72 hours CBC:  Recent Labs  10/12/12 0535 10/13/12 0540  WBC 5.0 4.1  HGB 11.6* 11.5*  HCT 35.2* 35.6*  MCV 89.3 89.2  PLT 100* 99*   Cardiac Enzymes:  Recent Labs  10/11/12 1917 10/12/12 0025  10/12/12 0535  TROPONINI <0.30 <0.30 <0.30   Fasting Lipid Panel:  Recent Labs  10/12/12 0535  CHOL 170  HDL 72  LDLCALC 86  TRIG 58  CHOLHDL 2.4    Imaging: Dg Chest 2 View  10/11/2012   *RADIOLOGY REPORT*  Clinical Data: Chest pain.  Arrhythmia.  CHEST - 2 VIEW  Comparison: None.  Findings: Cardiac silhouette moderately enlarged.  Thoracic aorta tortuous and atherosclerotic.  Hilar and mediastinal contours otherwise unremarkable.  Mildly prominent bronchovascular markings diffusely and mild central peribronchial thickening.  Lungs otherwise clear.  Normal pulmonary vascularity.  No pneumothorax. No pleural effusions.  Degenerative changes involving the thoracic spine and multiple thoracic vertebral compression fractures due to osteopenia.  Thoracolumbar scoliosis convex left.  IMPRESSION: Cardiomegaly.  No acute cardiopulmonary disease.  Mild changes of what is likely chronic bronchitis and/or asthma.   Original Report Authenticated By: Hulan Saas, M.D.    Cardiac Studies:  ECG:  SR long PR RBBB   Telemetry:  SB rates as low as 47  5/24  Today SR rates 70's no pauses  Echo:   Medications:   . aspirin  81 mg Oral Once  . cholecalciferol  1,000  Units Oral Daily  . isosorbide dinitrate  5 mg Oral TID  . multivitamin with minerals  1 tablet Oral Daily  . nitroGLYCERIN  1 inch Topical Q8H  . sodium chloride  3 mL Intravenous Q12H  . venlafaxine XR  150 mg Oral Daily     . heparin 950 Units/hr (10/12/12 1144)    Assessment/Plan:  Chest Pain: See note by [facility]  Cath on Tuesday. Enzymes negative. On heparin will add nitrates.  Cannot take Beta blockers due to apparent SSS lopressor stopped  and continue telemetry Murmur:  Echo appears to have mild AS  Patient wants laxative Pre cath orders written  Charlton Haws 10/13/2012, 8:06 AM

## 2012-10-14 DIAGNOSIS — I200 Unstable angina: Secondary | ICD-10-CM

## 2012-10-14 DIAGNOSIS — I498 Other specified cardiac arrhythmias: Secondary | ICD-10-CM

## 2012-10-14 LAB — CBC
HCT: 36.6 % (ref 36.0–46.0)
Hemoglobin: 12.1 g/dL (ref 12.0–15.0)
MCHC: 33 g/dL (ref 30.0–36.0)
Platelets: 92 10*3/uL — ABNORMAL LOW (ref 150–400)
RDW: 13.3 % (ref 11.5–15.5)
RDW: 13.3 % (ref 11.5–15.5)
WBC: 4.4 10*3/uL (ref 4.0–10.5)
WBC: 3.7 10*3/uL — ABNORMAL LOW (ref 4.0–10.5)

## 2012-10-14 LAB — BASIC METABOLIC PANEL
Chloride: 106 mEq/L (ref 96–112)
GFR calc Af Amer: >90 mL/min (ref >90)
GFR calc non Af Amer: 83 mL/min — ABNORMAL LOW (ref >90)
Potassium: 3.7 mEq/L (ref 3.5–5.1)
Sodium: 143 mEq/L (ref 135–145)

## 2012-10-14 LAB — HEPARIN LEVEL (UNFRACTIONATED): Heparin Unfractionated: 0.5 IU/mL (ref 0.30–0.70)

## 2012-10-14 MED ORDER — DIAZEPAM 5 MG PO TABS
5.0000 mg | ORAL_TABLET | ORAL | Status: AC
  Administered 2012-10-15: 5 mg via ORAL
  Filled 2012-10-14: qty 1

## 2012-10-14 MED ORDER — SODIUM CHLORIDE 0.9 % IV SOLN: 250.0000 mL | INTRAVENOUS | Status: DC | PRN

## 2012-10-14 MED ORDER — SODIUM CHLORIDE 0.9 % IV SOLN
1.0000 mL/kg/h | INTRAVENOUS | Status: DC
  Administered 2012-10-15: 1 mL/kg/h via INTRAVENOUS

## 2012-10-14 MED ORDER — SODIUM CHLORIDE 0.9 % IJ SOLN: 3.0000 mL | Freq: Two times a day (BID) | INTRAMUSCULAR | Status: DC

## 2012-10-14 MED ORDER — ASPIRIN 81 MG PO CHEW
324.0000 mg | CHEWABLE_TABLET | ORAL | Status: AC
  Administered 2012-10-15: 324 mg via ORAL
  Filled 2012-10-14: qty 4

## 2012-10-14 MED ORDER — SODIUM CHLORIDE 0.9 % IJ SOLN: 3.0000 mL | INTRAMUSCULAR | Status: DC | PRN

## 2012-10-14 NOTE — Progress Notes (Signed)
Utilization review completed. Chadwick Reiswig, RN, BSN. 

## 2012-10-14 NOTE — Progress Notes (Signed)
Pt/daughter apprehensive about cath and requested more information. I explained the procedure, the process, the potential outcomes, risks, and reasons for doing the procedure. I also offered for them to watch the cath video. They were appreciative and plan to proceed as previously outlined at this time. Dayna Dunn PA-C

## 2012-10-14 NOTE — Progress Notes (Signed)
ANTICOAGULATION CONSULT NOTE - Follow Up Consult  Pharmacy Consult for heparin  Indication: [place]  Allergies  Allergen Reactions  . Penicillins     Patient Measurements: Height: 5\' 4"  (162.6 cm) Weight: 171 lb 9.6 oz (77.837 kg) IBW/kg (Calculated) : 54.7 Heparin Dosing Weight:   Vital Signs: Temp: 98.1 F (36.7 C) (05/26 0535) Temp src: Oral (05/26 0535) BP: 148/61 mmHg (05/26 0535) Pulse Rate: 72 (05/26 0535)  Labs:  Recent Labs  10/11/12 1917  10/12/12 0025  10/12/12 0535 10/13/12 0540 10/14/12 0418 10/14/12 0808  HGB  --   --   --   < > 11.6* 11.5* 11.4* 12.1  HCT  --   --   --   < > 35.2* 35.6* 34.5* 36.6  PLT  --   --   --   < > 100* 99* 92* 96*  HEPARINUNFRC  --   < > 0.57  --  0.61 0.60 0.50  --   CREATININE  --   --   --   --  0.86  --   --  0.61  TROPONINI <0.30  --  <0.30  --  <0.30  --   --   --   < > = values in this interval not displayed.  Estimated Creatinine Clearance: 55.6 ml/min (by C-G formula based on Cr of 0.61).     Assessment and Plan: 79yo F admitted 10/11/2012   with no cardiac Hx except for remote palpitations,  presented with 1week Hx CP with exertion.  Pharmacy consulted to dose heparin  CV: ACS: Heparin,  Tp negative. CP free.H/H  stable low pltc -but thrombocytopenia on admit.  Heparin level at goal.   ASA, ISDN,   Neuro: Effexor  Home medications not ordered:  Nifedipine 30 daily  Goal of Therapy:  Heparin level 0.3-0.7 units/ml Monitor platelets by anticoagulation protocol: Yes    Plan: Continue heparin drip 950 uts/hr Daily HL/CBC   Thank you for allowing pharmacy to be a part of this patients care team.  Lovenia Kim Pharm.D., BCPS Clinical Pharmacist 10/14/2012 12:56 PM Pager: [phone] Phone: [phone]

## 2012-10-14 NOTE — Progress Notes (Addendum)
Subjective: No CP  No SOB.   Objective: Filed Vitals:   10/13/12 1333 10/13/12 1550 10/13/12 2124 10/14/12 0535  BP: 115/61 122/53 118/68 148/61  Pulse: 80  78 72  Temp: 98.4 F (36.9 C)  98.8 F (37.1 C) 98.1 F (36.7 C)  TempSrc: Oral   Oral  Resp: 16  18 18   Height:      Weight:    171 lb 9.6 oz (77.837 kg)  SpO2: 98%  98% 96%   Weight change: 8.4 oz (0.237 kg)  Intake/Output Summary (Last 24 hours) at 10/14/12 0734 Last data filed at 10/13/12 1700  Gross per 24 hour  Intake    960 ml  Output      0 ml  Net    960 ml    General: Alert, awake, oriented x3, in no acute distress Neck:  JVP is normal Heart: Regular rate and rhythm, without murmurs, rubs, gallops.  Lungs: Clear to auscultation.  No rales or wheezes. Exemities:  No edema.   Neuro: Grossly intact, nonfocal.  Tele:  SR Lab Results: Results for orders placed during the hospital encounter of 10/11/12 (from the past 24 hour(s))  HEPARIN LEVEL (UNFRACTIONATED)     Status: None   Collection Time    10/14/12  4:18 AM      Result Value Range   Heparin Unfractionated 0.50  0.30 - 0.70 IU/mL  CBC     Status: Abnormal   Collection Time    10/14/12  4:18 AM      Result Value Range   WBC 4.4  4.0 - 10.5 K/uL   RBC 3.85 (*) 3.87 - 5.11 MIL/uL   Hemoglobin 11.4 (*) 12.0 - 15.0 g/dL   HCT 16.1 (*) 09.6 - 04.5 %   MCV 89.6  78.0 - 100.0 fL   MCH 29.6  26.0 - 34.0 pg   MCHC 33.0  30.0 - 36.0 g/dL   RDW 40.9  81.1 - 91.4 %   Platelets 92 (*) 150 - 400 K/uL    Studies/Results: @RISRSLT24 @  Medications:  Reviewed.   @PROBHOSP @  1.  Unstable angina.  Plan for cath in AM to define anatomy.  Patient remains pain free on heparin 2.  Thrombocytopenia.  Plt 92K ths AM  Have been 98 and 100K since admit  Follow. 3.  Hx poss SSS.  No signif bradycardia or pauses over past 24 hours.  Not on b blocker.  (daughter has relayed msg that patient has been evaluated at [facility]) 4.  Lipids  LDL is 86  WIll await  cath before poss starting statin.  WIll Rx laxative per patient request.  LOS: 3 days   Dietrich Pates 10/14/2012, 7:34 AM

## 2012-10-15 ENCOUNTER — Encounter (HOSPITAL_COMMUNITY)
Admission: EM | Disposition: A | Discharge status: Home / Self Care | Payer: Self-pay | Source: Home / Self Care | Attending: Cardiology

## 2012-10-15 ENCOUNTER — Ambulatory Visit (HOSPITAL_COMMUNITY): Admit: 2012-10-15 | Payer: Self-pay | Admitting: Cardiovascular Disease

## 2012-10-15 DIAGNOSIS — I517 Cardiomegaly: Secondary | ICD-10-CM

## 2012-10-15 DIAGNOSIS — I2510 Atherosclerotic heart disease of native coronary artery without angina pectoris: Secondary | ICD-10-CM

## 2012-10-15 HISTORY — PX: LEFT HEART CATHETERIZATION WITH CORONARY ANGIOGRAM: SHX5451

## 2012-10-15 LAB — CBC
HCT: 33.8 % — ABNORMAL LOW (ref 36.0–46.0)
MCV: 89.4 fL (ref 78.0–100.0)
Platelets: 101 10*3/uL — ABNORMAL LOW (ref 150–400)
RBC: 3.78 MIL/uL — ABNORMAL LOW (ref 3.87–5.11)
RDW: 13.4 % (ref 11.5–15.5)
WBC: 4.2 10*3/uL (ref 4.0–10.5)

## 2012-10-15 LAB — PROTIME-INR: INR: 1.02 (ref 0.00–1.49)

## 2012-10-15 LAB — HEPARIN LEVEL (UNFRACTIONATED): Heparin Unfractionated: 0.37 IU/mL (ref 0.30–0.70)

## 2012-10-15 SURGERY — LEFT HEART CATHETERIZATION WITH CORONARY ANGIOGRAM

## 2012-10-15 MED ORDER — VERAPAMIL HCL 2.5 MG/ML IV SOLN
INTRAVENOUS | Status: AC
  Filled 2012-10-15: qty 2

## 2012-10-15 MED ORDER — MIDAZOLAM HCL 2 MG/2ML IJ SOLN
INTRAMUSCULAR | Status: AC
  Filled 2012-10-15: qty 2

## 2012-10-15 MED ORDER — SODIUM CHLORIDE 0.9 % IV SOLN: INTRAVENOUS | Status: AC

## 2012-10-15 MED ORDER — HEPARIN SODIUM (PORCINE) 1000 UNIT/ML IJ SOLN
INTRAMUSCULAR | Status: AC
  Filled 2012-10-15: qty 1

## 2012-10-15 MED ORDER — MAGNESIUM HYDROXIDE 400 MG/5ML PO SUSP
30.0000 mL | Freq: Every day | ORAL | Status: DC | PRN
  Administered 2012-10-15: 30 mL via ORAL
  Filled 2012-10-15 (×2): qty 30

## 2012-10-15 MED ORDER — LIDOCAINE HCL (PF) 1 % IJ SOLN
INTRAMUSCULAR | Status: AC
  Filled 2012-10-15: qty 30

## 2012-10-15 MED ORDER — HEPARIN (PORCINE) IN NACL 2-0.9 UNIT/ML-% IJ SOLN
INTRAMUSCULAR | Status: AC
  Filled 2012-10-15: qty 1000

## 2012-10-15 NOTE — Interval H&P Note (Signed)
History and Physical Interval Note:  10/15/2012 7:39 AM  Katherine Long  has presented today for cardiac cath with the diagnosis of chest pain concerning for unstable angina. The various methods of treatment have been discussed with the patient and family. After consideration of risks, benefits and other options for treatment, the patient has consented to  Procedure(s): LEFT HEART CATHETERIZATION WITH CORONARY ANGIOGRAM (N/A) as a surgical intervention .  The patient's history has been reviewed, patient examined, no change in status, stable for surgery.  I have reviewed the patient's chart and labs.  Questions were answered to the patient's satisfaction.     Karyna Bessler

## 2012-10-15 NOTE — H&P (View-Only) (Signed)
Subjective: No CP  No SOB.   Objective: Filed Vitals:   10/13/12 1333 10/13/12 1550 10/13/12 2124 10/14/12 0535  BP: 115/61 122/53 118/68 148/61  Pulse: 80  78 72  Temp: 98.4 F (36.9 C)  98.8 F (37.1 C) 98.1 F (36.7 C)  TempSrc: Oral   Oral  Resp: 16  18 18  Height:      Weight:    171 lb 9.6 oz (77.837 kg)  SpO2: 98%  98% 96%   Weight change: 8.4 oz (0.237 kg)  Intake/Output Summary (Last 24 hours) at 10/14/12 0734 Last data filed at 10/13/12 1700  Gross per 24 hour  Intake    960 ml  Output      0 ml  Net    960 ml    General: Alert, awake, oriented x3, in no acute distress Neck:  JVP is normal Heart: Regular rate and rhythm, without murmurs, rubs, gallops.  Lungs: Clear to auscultation.  No rales or wheezes. Exemities:  No edema.   Neuro: Grossly intact, nonfocal.  Tele:  SR Lab Results: Results for orders placed during the hospital encounter of 10/11/12 (from the past 24 hour(s))  HEPARIN LEVEL (UNFRACTIONATED)     Status: None   Collection Time    10/14/12  4:18 AM      Result Value Range   Heparin Unfractionated 0.50  0.30 - 0.70 IU/mL  CBC     Status: Abnormal   Collection Time    10/14/12  4:18 AM      Result Value Range   WBC 4.4  4.0 - 10.5 K/uL   RBC 3.85 (*) 3.87 - 5.11 MIL/uL   Hemoglobin 11.4 (*) 12.0 - 15.0 g/dL   HCT 34.5 (*) 36.0 - 46.0 %   MCV 89.6  78.0 - 100.0 fL   MCH 29.6  26.0 - 34.0 pg   MCHC 33.0  30.0 - 36.0 g/dL   RDW 13.3  11.5 - 15.5 %   Platelets 92 (*) 150 - 400 K/uL    Studies/Results: @RISRSLT24@  Medications:  Reviewed.   @PROBHOSP@  1.  Unstable angina.  Plan for cath in AM to define anatomy.  Patient remains pain free on heparin 2.  Thrombocytopenia.  Plt 92K ths AM  Have been 98 and 100K since admit  Follow. 3.  Hx poss SSS.  No signif bradycardia or pauses over past 24 hours.  Not on b blocker.  (daughter has relayed msg that patient has been evaluated at [facility]) 4.  Lipids  LDL is 86  WIll await  cath before poss starting statin.  WIll Rx laxative per patient request.  LOS: 3 days   Katherine Long 10/14/2012, 7:34 AM   

## 2012-10-15 NOTE — CV Procedure (Addendum)
    Cardiac Catheterization Operative Report  Katherine Long [id number] 5/27/20147:40 AM PROCHNAU,CAROLINE, MD  Procedure Performed:  1. Left Heart Catheterization 2. Selective Coronary Angiography 3. Left ventricular angiogram  Operator: Verne Carrow, MD  Arterial access site:  Right radial artery.   Indication: 79 yo female with history of HLD, fibromyalgia who is admitted with chest pain typical for unstable angina. She has had chest pain with minimal exertion. Cardiac markers negative.                                  Procedure Details: The risks, benefits, complications, treatment options, and expected outcomes were discussed with the patient. The patient and/or family concurred with the proposed plan, giving informed consent. The patient was brought to the cath lab after IV hydration was begun and oral premedication was given. The patient was further sedated with Versed. The right wrist was assessed with an Allens test which was positive. The right wrist was prepped and draped in a sterile fashion. 1% lidocaine was used for local anesthesia. Using the modified Seldinger access technique, a 5 French sheath was placed in the right radial artery. 3 mg Verapamil was given through the sheath. 4000 units IV heparin was given. We had difficulty engaging all vessels from the radial approach but ultimately were able to engage the RCA with a ERAD right catheter and the left main with the the JL3.5 catheter. Selective coronary angiography was performed. A pigtail catheter was used to perform a left ventricular angiogram. The sheath was removed from the right radial artery and a Terumo hemostasis band was applied at the arteriotomy site on the right wrist.   There were no immediate complications. The patient was taken to the recovery area in stable condition.   Hemodynamic Findings: Central aortic pressure: 153/67 Left ventricular pressure: 154/7/17  Angiographic Findings:  Left main:  No obstructive disease.   Left Anterior Descending Artery:  Moderate caliber vessel that courses to the apex. The mid vessel has 30% stenosis. The diagonal branch is moderate in caliber with 50% ostial stenosis.   Circumflex Artery: Moderate caliber vessel with no significant obstructive disease. There are several small to moderate caliber obtuse marginal branches.   Right Coronary Artery: Large caliber, dominant vessel with 40% mid stenosis.   Left Ventricular Angiogram: LVEF 55-60%.  Impression: 1. Mild to moderate non-obstructive CAD 2. Normal LV systolic function 3. Non-cardiac chest pain.   Recommendations: No further ischemic workup. She can be started on a statin for CAD. Her family wishes to have the echo done here. Will plan echo today to exclude significant valvular disease. She was not on a beta blocker at home but had bradycardia here in the hospital when started. Procardia is a home med and has been held. Would not restart Procardia with bradycardia. Monitor on telemetry for 24 more hours. May need event monitor at home if no evidence of high grade AV block while on telemetry. Plan d/c on 10/16/12.         Complications:  None. The patient tolerated the procedure well.

## 2012-10-15 NOTE — Progress Notes (Signed)
TR BAND REMOVAL  LOCATION:    right radial  DEFLATED PER PROTOCOL:    yes  TIME BAND OFF / DRESSING APPLIED:    1115   SITE UPON ARRIVAL:    Level 0  SITE AFTER BAND REMOVAL:    Level 0  REVERSE ALLEN'S TEST:     positive  CIRCULATION SENSATION AND MOVEMENT:    Within Normal Limits   yes  COMMENTS:   1115 Removed TR band and applied gauze dressing with tegaderm    1145 Gauze dressing dry and intact, CSM's wnls, radial and ulnar pulses +2

## 2012-10-15 NOTE — Progress Notes (Signed)
  Echocardiogram 2D Echocardiogram has been performed.  Katherine Long 10/15/2012, 5:31 PM

## 2012-10-16 ENCOUNTER — Encounter (HOSPITAL_COMMUNITY): Payer: Self-pay | Admitting: Physician Assistant

## 2012-10-16 DIAGNOSIS — R001 Bradycardia, unspecified: Secondary | ICD-10-CM

## 2012-10-16 LAB — CBC
HCT: 35.2 % — ABNORMAL LOW (ref 36.0–46.0)
MCHC: 33 g/dL (ref 30.0–36.0)
MCV: 90 fL (ref 78.0–100.0)
Platelets: 102 10*3/uL — ABNORMAL LOW (ref 150–400)
RDW: 13.5 % (ref 11.5–15.5)
WBC: 4.2 10*3/uL (ref 4.0–10.5)

## 2012-10-16 MED ORDER — NITROGLYCERIN 0.4 MG SL SUBL: 0.4000 mg | SUBLINGUAL_TABLET | SUBLINGUAL | Status: DC | PRN

## 2012-10-16 MED ORDER — ASPIRIN 81 MG PO TABS: 81.0000 mg | ORAL_TABLET | Freq: Every day | ORAL | Status: DC

## 2012-10-16 MED ORDER — METOPROLOL TARTRATE 25 MG PO TABS: 25.0000 mg | ORAL_TABLET | Freq: Two times a day (BID) | ORAL | Status: DC

## 2012-10-16 MED ORDER — ATORVASTATIN CALCIUM 20 MG PO TABS: 20.0000 mg | ORAL_TABLET | Freq: Every day | ORAL | Status: DC

## 2012-10-16 MED ORDER — FUROSEMIDE 20 MG PO TABS: 20.0000 mg | ORAL_TABLET | ORAL | Status: DC

## 2012-10-16 MED ORDER — METOPROLOL TARTRATE 25 MG PO TABS
25.0000 mg | ORAL_TABLET | Freq: Two times a day (BID) | ORAL | Status: DC
  Administered 2012-10-16: 10:00:00 25 mg via ORAL
  Filled 2012-10-16: qty 1

## 2012-10-16 MED ORDER — FUROSEMIDE 20 MG PO TABS
20.0000 mg | ORAL_TABLET | Freq: Every day | ORAL | Status: DC
  Administered 2012-10-16: 20 mg via ORAL
  Filled 2012-10-16: qty 1

## 2012-10-16 NOTE — Care Management Note (Signed)
    Page 1 of 1   10/16/2012     10:54:08 AM   CARE MANAGEMENT NOTE 10/16/2012  Patient:  Katherine Long,Katherine Long   Account Number:  [id number]  Date Initiated:  10/16/2012  Documentation initiated by:  [facility]  Subjective/Objective Assessment:   79yo with unstable angina     Action/Plan:   Cardiac Cath/ hm self care   Anticipated DC Date:  10/16/2012   Anticipated DC Plan:  HOME/SELF CARE         Choice offered to / List presented to:             Status of service:   Medicare Important Message given?   (If response is "NO", the following Medicare IM given date fields will be blank) Date Medicare IM given:   Date Additional Medicare IM given:    Discharge Disposition:    Per UR Regulation:    If discussed at Long Length of Stay Meetings, dates discussed:    Comments:  10/16/12.Marland KitchenMarland KitchenOletta Cohn, RN, BSN, [place] [phone] No home health or DME needs identified at this time.

## 2012-10-16 NOTE — Discharge Summary (Signed)
Discharge Summary   Patient ID: Katherine Long MRN: [id number], DOB/AGE: Oct 31, 1928 79 y.o. Admit date: 10/11/2012 D/C date:     10/16/2012  Primary Cardiologist: Florian Buff, [facility]  Primary Discharge Diagnoses:  1. Chest discomfort/shortness of breath/palpitations felt secondary to acute diastolic CHF - cardiac cath 10/15/12 showing mild-moderate nonobstructive CAD without cause for symptoms - 2D echo 10/15/12: EF 55-60%, grade 1 diastolic dysfunction, mild LVH - for OP event monitor 2. Bradycardia, resolved 3. RBBB 4. Thrombocytopenia  Secondary Discharge Diagnoses:  1. Fibromyalgia 2. HLD 3. Impaired fasting glucose - A1C 5.6 this admission 4. H/o Sternal fracture 5. H/o Vertebral fracture, closed  6. H/o Rib fracture  7. Allergic rhinitis   Hospital Course: Katherine Long is an 79 y/o F with history of fibromyalgia, HLD and no prior cardiac history who presented with 1 week history of chest discomfort occurring with exertion accompanied by dyspnea. With this, she had some nausea and diaphoresis. Initial troponin was negative and EKG showed NSR with RBBB, first degree AVB, without acute ST-T changes. Symptoms were felt concerning for [place] thus heparin was started. She was started on Lopressor as part of ACS therapy. The patient was then noted to have bradycardia with HR in the 40's, thus Lopressor and home Procardia were discontinued with improvement in HR. Troponins remained negative. She underwent cardiac cath 5/27 demonstrating mild-mod nonobstructive CAD and no culprit for symptoms. Statin/ASA were recommended. EF was 55-60%. 2D echo was performed showing mild LVH, grade 1 diastolic dysfunction, normal EF, no significant valvular disease. Dr. Graciela Husbands saw the patient this AM with regards to her bradycardia. She has had no further bradyarrhythmias on telemetry since meds were adjusted. She did report a several-year history of "skipped" heart beats with prior event monitoring  that was unremarkable. Dr. Graciela Husbands felt her symptoms were likely related to HF with preserved EF. He initially was going to start diltiazem, then decided to re-initiate Lopressor 25mg  BID (and continue to hold home Procardia). He also recommended Lasix 20mg  every other day and 30-day event recorder with further f/u with her cardiologist Dr. Sherril Croon whom she already has a longstanding relationship with. Per Dr. Graciela Husbands, her event monitor has been arranged through Dr. Verl Dicker office and she already has appointment with Dr. Sherril Croon. She has ambulated without complication. Dr. Graciela Husbands has seen and examined her today and feels she is stable for discharge. Of note, platelet count was mildly reduced this admission (LFTs normal) thus she was instructed to f/u PCP for further evaluation. She was also instructed to discuss followup labwork for statin initiation with her cardiologist.   Discharge Vitals: Blood pressure 142/92, pulse 77, temperature 98.4 F (36.9 C), temperature source Oral, resp. rate 18, height 5\' 4"  (1.626 m), weight 173 lb 4.5 oz (78.6 kg), SpO2 94.00%.  Labs: Lab Results  Component Value Date   WBC 4.2 10/16/2012   HGB 11.6* 10/16/2012   HCT 35.2* 10/16/2012   MCV 90.0 10/16/2012   PLT 102* 10/16/2012     Recent Labs Lab 10/12/12 0535 10/14/12 0808  NA 143 143  K 4.9 3.7  CL 107 106  CO2 28 26  BUN 19 9  CREATININE 0.86 0.61  CALCIUM 8.9 9.1  PROT 5.7*  --   BILITOT 0.5  --   ALKPHOS 81  --   ALT 12  --   AST 15  --   GLUCOSE 105* 123*    Lab Results  Component Value Date   CHOL 170 10/12/2012  HDL 72 10/12/2012   LDLCALC 86 10/12/2012   TRIG 58 10/12/2012    Diagnostic Studies/Procedures   Cardiac Cath 10/15/12 Hemodynamic Findings:  Central aortic pressure: 153/67  Left ventricular pressure: 154/7/17  Angiographic Findings:  Left main: No obstructive disease.  Left Anterior Descending Artery: Moderate caliber vessel that courses to the apex. The mid vessel has 30% stenosis.  The diagonal branch is moderate in caliber with 50% ostial stenosis.  Circumflex Artery: Moderate caliber vessel with no significant obstructive disease. There are several small to moderate caliber obtuse marginal branches.  Right Coronary Artery: Large caliber, dominant vessel with 40% mid stenosis.  Left Ventricular Angiogram: LVEF 55-60%.  Impression:  1. Mild to moderate non-obstructive CAD  2. Normal LV systolic function  3. Non-cardiac chest pain.    Dg Chest 2 View 10/11/2012   *RADIOLOGY REPORT*  Clinical Data: Chest pain.  Arrhythmia.  CHEST - 2 VIEW  Comparison: None.  Findings: Cardiac silhouette moderately enlarged.  Thoracic aorta tortuous and atherosclerotic.  Hilar and mediastinal contours otherwise unremarkable.  Mildly prominent bronchovascular markings diffusely and mild central peribronchial thickening.  Lungs otherwise clear.  Normal pulmonary vascularity.  No pneumothorax. No pleural effusions.  Degenerative changes involving the thoracic spine and multiple thoracic vertebral compression fractures due to osteopenia.  Thoracolumbar scoliosis convex left.  IMPRESSION: Cardiomegaly.  No acute cardiopulmonary disease.  Mild changes of what is likely chronic bronchitis and/or asthma.   Original Report Authenticated By: Hulan Saas, M.D.    Discharge Medications     Medication List    STOP taking these medications       NIFEdipine 30 MG 24 hr tablet  Commonly known as:  PROCARDIA-XL/ADALAT CC     oxymetazoline 0.05 % nasal spray  Commonly known as:  AFRIN      TAKE these medications       aspirin 81 MG tablet  Take 1 tablet (81 mg total) by mouth daily.     atorvastatin 20 MG tablet  Commonly known as:  LIPITOR  Take 1 tablet (20 mg total) by mouth daily.     cholecalciferol 1000 UNITS tablet  Commonly known as:  VITAMIN D  Take 1,000 Units by mouth daily.     furosemide 20 MG tablet  Commonly known as:  LASIX  Take 1 tablet (20 mg total) by mouth every  other day.     metoprolol tartrate 25 MG tablet  Commonly known as:  LOPRESSOR  Take 1 tablet (25 mg total) by mouth 2 (two) times daily.     multivitamin with minerals Tabs  Take 1 tablet by mouth daily.     nitroGLYCERIN 0.4 MG SL tablet  Commonly known as:  NITROSTAT  Place 1 tablet (0.4 mg total) under the tongue every 5 (five) minutes as needed for chest pain (up to 3 doses).     tiZANidine 4 MG tablet  Commonly known as:  ZANAFLEX  Take 4 mg by mouth every 8 (eight) hours as needed (for muscle pain).     venlafaxine XR 150 MG 24 hr capsule  Commonly known as:  EFFEXOR-XR  Take 150 mg by mouth daily.        Disposition   The patient will be discharged in stable condition to home. Discharge Orders   Future Orders Complete By Expires     Diet - low sodium heart healthy  As directed     Discharge instructions  As directed     Comments:  You were started on medication for your cholesterol since your heart catheterization showed some non-blocking plaque. Please discuss followup labwork with your cardiologist. (We typically like to check liver function and cholesterol panel in about 6-8 weeks.) Dr. Sherril Croon may also want to check bloodwork since you were started on Lasix before your next appointment - please ask her. Further refills of medicine should come from your primary cardiologist.  Dr. Graciela Husbands suspects you may have a degree of congestive heart failure leading to your symptoms. For patients with CHF, we give them these special instructions:  1. Follow a low-salt diet and watch your fluid intake. In general, you should not be taking in more than 2 liters of fluid per day (no more than 8 glasses per day). Some patients are restricted to less than 1.5 liters of fluid per day (no more than 6 glasses per day). This includes sources of water in foods like soup, coffee, tea, milk, etc. 2. Weigh yourself on the same scale at same time of day and keep a log. 3. Call your doctor:  (Anytime you feel any of the following symptoms)  - 3-4 pound weight gain in 1-2 days or 2 pounds overnight  - Shortness of breath, with or without a dry hacking cough  - Swelling in the hands, feet or stomach  - If you have to sleep on extra pillows at night in order to breathe   IT IS IMPORTANT TO LET YOUR DOCTOR KNOW EARLY ON IF YOU ARE HAVING SYMPTOMS SO WE CAN HELP YOU!    Increase activity slowly  As directed     Comments:      No driving for 2 days. No lifting over 5 lbs for 1 week. No sexual activity for 1 week. Keep procedure site clean & dry. If you notice increased pain, swelling, bleeding or pus, call/return!  You may shower, but no soaking baths/hot tubs/pools for 1 week.      Follow-up Information   Follow up with Earl Many, MD On 11/18/2012. (1230 PM. Their office will contact you to set up your heart monitor. Please call their office if you have not heard from their office within 2-3 days to arrange this.)    Contact information:   [facility] [address] [place] [address] [phone]       Follow up with PROCHNAU,CAROLINE, MD. (Your platelet count was mildly decreased this admission - please follow up with primary care doctor to determine if further monitoring is necessary.)    Contact information:   [address] [place] [address] [phone]         Duration of Discharge Encounter: Greater than 30 minutes including physician and PA time.  Signed, Ying Blankenhorn PA-C 10/16/2012, 10:10 AM
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
## 2012-12-16 ENCOUNTER — Other Ambulatory Visit (HOSPITAL_COMMUNITY): Payer: Self-pay | Admitting: Physician Assistant

## 2013-02-05 ENCOUNTER — Other Ambulatory Visit (HOSPITAL_COMMUNITY): Payer: Self-pay | Admitting: Physician Assistant

## 2013-02-11 ENCOUNTER — Other Ambulatory Visit (HOSPITAL_COMMUNITY): Payer: Self-pay | Admitting: Physician Assistant

## 2014-04-30 ENCOUNTER — Encounter (HOSPITAL_COMMUNITY): Payer: Self-pay | Admitting: Cardiovascular Disease

## 2015-05-26 DIAGNOSIS — I480 Paroxysmal atrial fibrillation: Secondary | ICD-10-CM | POA: Diagnosis not present

## 2015-05-26 DIAGNOSIS — J9801 Acute bronchospasm: Secondary | ICD-10-CM | POA: Diagnosis not present

## 2015-05-26 DIAGNOSIS — Z950 Presence of cardiac pacemaker: Secondary | ICD-10-CM | POA: Diagnosis not present

## 2015-05-26 DIAGNOSIS — R0602 Shortness of breath: Secondary | ICD-10-CM | POA: Diagnosis not present

## 2015-06-08 DIAGNOSIS — J4520 Mild intermittent asthma, uncomplicated: Secondary | ICD-10-CM | POA: Diagnosis not present

## 2015-06-08 DIAGNOSIS — R5383 Other fatigue: Secondary | ICD-10-CM | POA: Diagnosis not present

## 2015-06-08 DIAGNOSIS — J310 Chronic rhinitis: Secondary | ICD-10-CM | POA: Diagnosis not present

## 2015-06-08 DIAGNOSIS — G4733 Obstructive sleep apnea (adult) (pediatric): Secondary | ICD-10-CM | POA: Diagnosis not present

## 2015-06-14 DIAGNOSIS — R0602 Shortness of breath: Secondary | ICD-10-CM | POA: Diagnosis not present

## 2015-06-23 DIAGNOSIS — I480 Paroxysmal atrial fibrillation: Secondary | ICD-10-CM | POA: Diagnosis not present

## 2015-06-23 DIAGNOSIS — R0602 Shortness of breath: Secondary | ICD-10-CM | POA: Diagnosis not present

## 2015-06-23 DIAGNOSIS — Z950 Presence of cardiac pacemaker: Secondary | ICD-10-CM | POA: Diagnosis not present

## 2015-06-23 DIAGNOSIS — J9801 Acute bronchospasm: Secondary | ICD-10-CM | POA: Diagnosis not present

## 2015-08-02 DIAGNOSIS — I498 Other specified cardiac arrhythmias: Secondary | ICD-10-CM | POA: Diagnosis not present

## 2015-08-02 DIAGNOSIS — Z45010 Encounter for checking and testing of cardiac pacemaker pulse generator [battery]: Secondary | ICD-10-CM | POA: Diagnosis not present

## 2015-08-10 DIAGNOSIS — R0789 Other chest pain: Secondary | ICD-10-CM | POA: Diagnosis not present

## 2015-08-10 DIAGNOSIS — Z950 Presence of cardiac pacemaker: Secondary | ICD-10-CM | POA: Diagnosis not present

## 2015-08-10 DIAGNOSIS — I480 Paroxysmal atrial fibrillation: Secondary | ICD-10-CM | POA: Diagnosis not present

## 2015-08-10 DIAGNOSIS — R0602 Shortness of breath: Secondary | ICD-10-CM | POA: Diagnosis not present

## 2015-10-01 ENCOUNTER — Encounter: Payer: Self-pay | Admitting: Pulmonary Disease

## 2015-10-01 ENCOUNTER — Ambulatory Visit (INDEPENDENT_AMBULATORY_CARE_PROVIDER_SITE_OTHER): Payer: PPO | Admitting: Pulmonary Disease

## 2015-10-01 VITALS — BP 138/78 | HR 66 | Ht 64.0 in | Wt 181.0 lb

## 2015-10-01 DIAGNOSIS — R05 Cough: Secondary | ICD-10-CM | POA: Diagnosis not present

## 2015-10-01 DIAGNOSIS — E669 Obesity, unspecified: Secondary | ICD-10-CM

## 2015-10-01 DIAGNOSIS — G4733 Obstructive sleep apnea (adult) (pediatric): Secondary | ICD-10-CM

## 2015-10-01 DIAGNOSIS — R059 Cough, unspecified: Secondary | ICD-10-CM | POA: Insufficient documentation

## 2015-10-01 NOTE — Assessment & Plan Note (Addendum)
Pt has moderate OSA and is on cpap 10 cm H2O. She got a new machine 2016. DL last month : 83%, AHI 1.1 Continues to feel benefit of CPAP. Feels better using it. More energy. Less sleepiness. She recently switched insurance and her sleep doctor was no longer network. She needed to see a new sleep doctor. Has not received any supplies in a year. No recent download.  Plan: 1. Continue CPAP 10 cm water. 2. Need to retrieve office notes  from Dr. Vonzella Nipple and need to retrieve sleep study. 3. Needs download 4. Needs supplies. Her DME is [facility].

## 2015-10-01 NOTE — Assessment & Plan Note (Signed)
Recent congestion, sinus isues. Not on meds. May need flonase, claritin. Holding off for now.

## 2015-10-01 NOTE — Progress Notes (Addendum)
Subjective:    Patient ID: Katherine Long, female    DOB: 08-Oct-1928, 82 y.o.   MRN: [id number]  HPI   This is the case of Katherine Long, 82 y.o. Female, who was referred by in consultation regarding OSA by  Dr. Ernestene Kiel.   As you very well know, patient is a non smoker, not known to have asthma or copd. Has sinus issues. Controlled off meds.  Pt was dxed with OSA 5 yrs ago. Moderate. On cpap 10 cm H20.  Feels better using cpap. More energy. Less sleepiness.  No issues with it.  She got a new machine in 2016.  No recent supplies.  No recent DL.   Fairly fxnal. Takes care of herself.  No recent admission.      Review of Systems  Constitutional: Negative.  Negative for fever and unexpected weight change.  HENT: Positive for congestion, ear pain, postnasal drip and sinus pressure. Negative for dental problem, nosebleeds, rhinorrhea, sneezing, sore throat and trouble swallowing.   Eyes: Positive for redness and itching.  Respiratory: Positive for cough. Negative for chest tightness, shortness of breath and wheezing.   Cardiovascular: Positive for palpitations. Negative for leg swelling.  Gastrointestinal: Negative.  Negative for nausea and vomiting.  Endocrine: Negative.   Genitourinary: Negative.  Negative for dysuria.  Musculoskeletal: Positive for joint swelling and arthralgias.  Skin: Negative.  Negative for rash.  Allergic/Immunologic: Negative.   Neurological: Positive for dizziness, light-headedness and headaches.  Hematological: Bruises/bleeds easily.  Psychiatric/Behavioral: Negative.  Negative for dysphoric mood. The patient is not nervous/anxious.    Past Medical History  Diagnosis Date  . Fibromyalgia   . Hyperlipidemia   . Impaired fasting glucose     a. A1C 5.6 in 09/2012.  Marland Kitchen Sternal fracture   . Vertebral fracture, closed   . Rib fracture   . Allergic rhinitis   . RBBB   . Thrombocytopenia ([place])     a. Noted 09/2012.  Marland Kitchen CAD (coronary  artery disease)     a. Mild-mod nonobstructive by cath 09/2012, sx felt due to HFpEF.  . Diastolic CHF ([place])     a. 09/2012 - EF 55-60%, grade 1 d/d, mild LVH.   (-) CA, DVT  Family History  Problem Relation Age of Onset  . Arrhythmia Mother   . CAD Father 51    Died age 45  . CAD Brother 52  . Cancer Brother     Lung  . Cancer Mother     Kidney     Past Surgical History  Procedure Laterality Date  . Appendectomy    . Cholecystectomy    . Tubal ligation    . Tonsillectomy and adenoidectomy    . Left heart catheterization with coronary angiogram N/A 10/15/2012    Procedure: LEFT HEART CATHETERIZATION WITH CORONARY ANGIOGRAM;  Surgeon: Burnell Blanks, MD;  Location: [facility] CATH LAB;  Service: Cardiovascular;  Laterality: N/A;    Social History   Social History  . Marital Status: Married    Spouse Name: N/A  . Number of Children: 2  . Years of Education: N/A   Occupational History  . Not on file.   Social History Main Topics  . Smoking status: Never Smoker   . Smokeless tobacco: Never Used  . Alcohol Use: No  . Drug Use: No  . Sexual Activity: Not on file   Other Topics Concern  . Not on file   Social History Narrative   Lives with husband.  Allergies  Allergen Reactions  . Shellfish-Derived Products Anaphylaxis  . Latex   . Other Other (See Comments)    PT REPORTS THAT NEWSPAPER INK MAKES HER SNEEZE AND EYE ITCH/WATER  . Penicillins      Outpatient Prescriptions Prior to Visit  Medication Sig Dispense Refill  . aspirin 81 MG tablet Take 1 tablet (81 mg total) by mouth daily.    . cholecalciferol (VITAMIN D) 1000 UNITS tablet Take 1,000 Units by mouth daily.    . furosemide (LASIX) 20 MG tablet TAKE 1 TABLET BY MOUTH EVERY OTHER DAY 30 tablet 0  . Multiple Vitamin (MULTIVITAMIN WITH MINERALS) TABS Take 1 tablet by mouth daily.    . nitroGLYCERIN (NITROSTAT) 0.4 MG SL tablet Place 1 tablet (0.4 mg total) under the tongue every 5 (five)  minutes as needed for chest pain (up to 3 doses). 25 tablet 1  . tiZANidine (ZANAFLEX) 4 MG tablet Take 4 mg by mouth every 8 (eight) hours as needed (for muscle pain).    Marland Kitchen venlafaxine XR (EFFEXOR-XR) 150 MG 24 hr capsule Take 150 mg by mouth daily.    Marland Kitchen atorvastatin (LIPITOR) 20 MG tablet TAKE 1 TABLET BY MOUTH EVERY DAY 30 tablet 6  . metoprolol tartrate (LOPRESSOR) 25 MG tablet Take 1 tablet (25 mg total) by mouth 2 (two) times daily. (Patient not taking: Reported on 10/01/2015) 60 tablet 1   No facility-administered medications prior to visit.   Meds ordered this encounter  Medications  . ECHINACEA-GOLDEN SEAL PO    Sig: Take by mouth.  . Glucosamine-Chondroit-Vit C-Mn (GLUCOSAMINE CHONDR 1500 COMPLX PO)    Sig: Take by mouth.           Objective:   Physical Exam   Vitals:  Filed Vitals:   10/01/15 1155  BP: 138/78  Pulse: 66  Height: 5\' 4"  (1.626 m)  Weight: 181 lb (82.101 kg)  SpO2: 93%    Constitutional/General:  Pleasant, well-nourished, well-developed, not in any distress,  Comfortably seating.  Well kempt  Body mass index is 31.05 kg/(m^2). Wt Readings from Last 3 Encounters:  10/01/15 181 lb (82.101 kg)  10/16/12 173 lb 4.5 oz (78.6 kg)    HEENT: Pupils equal and reactive to light and accommodation. Anicteric sclerae. Normal nasal mucosa.   No oral  lesions,  mouth clear,  oropharynx clear, no postnasal drip. (-) Oral thrush. No dental caries.  Airway - Mallampati class III-IV.   Neck: No masses. Midline trachea. No JVD, (-) LAD. (-) bruits appreciated.  Respiratory/Chest: Grossly normal chest. (-) deformity. (-) Accessory muscle use.  Symmetric expansion. (-) Tenderness on palpation.  Resonant on percussion.  Diminished BS on both lower lung zones. (-) wheezing, crackles, rhonchi (-) egophony  Cardiovascular: Regular rate and  rhythm, heart sounds normal, no murmur or gallops, no peripheral edema  Gastrointestinal:  Normal bowel sounds. Soft,  non-tender. No hepatosplenomegaly.  (-) masses.   Musculoskeletal:  Normal muscle tone. Normal gait.   Extremities: Grossly normal. (-) clubbing, cyanosis.  (-) edema  Skin: (-) rash,lesions seen.   Neurological/Psychiatric : alert, oriented to time, place, person. Normal mood and affect           Assessment & Plan:  OSA (obstructive sleep apnea) Pt has moderate OSA and is on cpap 10 cm H2O. She got a new machine 2016. DL last month : 83%, AHI 1.1 Continues to feel benefit of CPAP. Feels better using it. More energy. Less sleepiness. She recently switched insurance and her  sleep doctor was no longer network. She needed to see a new sleep doctor. Has not received any supplies in a year. No recent download.  Plan: 1. Continue CPAP 10 cm water. 2. Need to retrieve office notes  from Dr. Vonzella Nipple and need to retrieve sleep study. 3. Needs download 4. Needs supplies. Her DME is [employer].    Cough Recent congestion, sinus isues. Not on meds. May need flonase, claritin. Holding off for now.   Obesity Weight reduction    Thank you very much for letting me participate in this patient's care. Please do not hesitate to give me a call if you have any questions or concerns regarding the treatment plan.   Patient will follow up with me in 1 yr, sooner if with issues.     Monica Becton, MD 10/01/2015   3:25 PM [facility] [place] Pager: [phone] Office: [phone], Fax: [phone]

## 2015-10-01 NOTE — Assessment & Plan Note (Signed)
Weight reduction 

## 2015-10-01 NOTE — Patient Instructions (Signed)
1. Continue using your  CPAP. 2. We will order supplies from [facility]. If you don't receive any supplies within the next couple weeks, please give [place] a call.  Return to clinic in 1 yr.

## 2015-10-04 DIAGNOSIS — G4733 Obstructive sleep apnea (adult) (pediatric): Secondary | ICD-10-CM | POA: Diagnosis not present

## 2015-10-27 DIAGNOSIS — J209 Acute bronchitis, unspecified: Secondary | ICD-10-CM | POA: Diagnosis not present

## 2015-11-02 DIAGNOSIS — Z950 Presence of cardiac pacemaker: Secondary | ICD-10-CM | POA: Diagnosis not present

## 2015-11-02 DIAGNOSIS — R0602 Shortness of breath: Secondary | ICD-10-CM | POA: Diagnosis not present

## 2015-11-02 DIAGNOSIS — R0789 Other chest pain: Secondary | ICD-10-CM | POA: Diagnosis not present

## 2015-11-02 DIAGNOSIS — I480 Paroxysmal atrial fibrillation: Secondary | ICD-10-CM | POA: Diagnosis not present

## 2015-11-26 DIAGNOSIS — M19211 Secondary osteoarthritis, right shoulder: Secondary | ICD-10-CM | POA: Diagnosis not present

## 2015-11-26 DIAGNOSIS — M1611 Unilateral primary osteoarthritis, right hip: Secondary | ICD-10-CM | POA: Diagnosis not present

## 2015-12-13 DIAGNOSIS — M7060 Trochanteric bursitis, unspecified hip: Secondary | ICD-10-CM | POA: Diagnosis not present

## 2015-12-13 DIAGNOSIS — R5383 Other fatigue: Secondary | ICD-10-CM | POA: Diagnosis not present

## 2015-12-13 DIAGNOSIS — M797 Fibromyalgia: Secondary | ICD-10-CM | POA: Diagnosis not present

## 2015-12-13 DIAGNOSIS — R0602 Shortness of breath: Secondary | ICD-10-CM | POA: Diagnosis not present

## 2015-12-28 DIAGNOSIS — Z950 Presence of cardiac pacemaker: Secondary | ICD-10-CM | POA: Diagnosis not present

## 2016-01-12 DIAGNOSIS — F330 Major depressive disorder, recurrent, mild: Secondary | ICD-10-CM | POA: Diagnosis not present

## 2016-01-13 DIAGNOSIS — M797 Fibromyalgia: Secondary | ICD-10-CM | POA: Diagnosis not present

## 2016-01-13 DIAGNOSIS — J302 Other seasonal allergic rhinitis: Secondary | ICD-10-CM | POA: Diagnosis not present

## 2016-01-13 DIAGNOSIS — R3989 Other symptoms and signs involving the genitourinary system: Secondary | ICD-10-CM | POA: Diagnosis not present

## 2016-01-13 DIAGNOSIS — I2510 Atherosclerotic heart disease of native coronary artery without angina pectoris: Secondary | ICD-10-CM | POA: Diagnosis not present

## 2016-01-20 DIAGNOSIS — Z961 Presence of intraocular lens: Secondary | ICD-10-CM | POA: Diagnosis not present

## 2016-02-02 DIAGNOSIS — F330 Major depressive disorder, recurrent, mild: Secondary | ICD-10-CM | POA: Diagnosis not present

## 2016-02-03 DIAGNOSIS — I480 Paroxysmal atrial fibrillation: Secondary | ICD-10-CM | POA: Diagnosis not present

## 2016-02-03 DIAGNOSIS — Z950 Presence of cardiac pacemaker: Secondary | ICD-10-CM | POA: Diagnosis not present

## 2016-03-28 DIAGNOSIS — Z950 Presence of cardiac pacemaker: Secondary | ICD-10-CM | POA: Diagnosis not present

## 2016-05-23 DIAGNOSIS — J9801 Acute bronchospasm: Secondary | ICD-10-CM | POA: Diagnosis not present

## 2016-05-23 DIAGNOSIS — R0789 Other chest pain: Secondary | ICD-10-CM | POA: Diagnosis not present

## 2016-05-23 DIAGNOSIS — I480 Paroxysmal atrial fibrillation: Secondary | ICD-10-CM | POA: Diagnosis not present

## 2016-05-23 DIAGNOSIS — R0602 Shortness of breath: Secondary | ICD-10-CM | POA: Diagnosis not present

## 2016-05-23 DIAGNOSIS — Z950 Presence of cardiac pacemaker: Secondary | ICD-10-CM | POA: Diagnosis not present

## 2016-06-20 DIAGNOSIS — R51 Headache: Secondary | ICD-10-CM | POA: Diagnosis not present

## 2016-06-20 DIAGNOSIS — S01111A Laceration without foreign body of right eyelid and periocular area, initial encounter: Secondary | ICD-10-CM | POA: Diagnosis not present

## 2016-06-20 DIAGNOSIS — S01112A Laceration without foreign body of left eyelid and periocular area, initial encounter: Secondary | ICD-10-CM | POA: Diagnosis not present

## 2016-06-20 DIAGNOSIS — S61511A Laceration without foreign body of right wrist, initial encounter: Secondary | ICD-10-CM | POA: Diagnosis not present

## 2016-06-20 DIAGNOSIS — W010XXA Fall on same level from slipping, tripping and stumbling without subsequent striking against object, initial encounter: Secondary | ICD-10-CM | POA: Diagnosis not present

## 2016-06-20 DIAGNOSIS — S199XXA Unspecified injury of neck, initial encounter: Secondary | ICD-10-CM | POA: Diagnosis not present

## 2016-06-20 DIAGNOSIS — R42 Dizziness and giddiness: Secondary | ICD-10-CM | POA: Diagnosis not present

## 2016-06-20 DIAGNOSIS — S0181XA Laceration without foreign body of other part of head, initial encounter: Secondary | ICD-10-CM | POA: Diagnosis not present

## 2016-06-20 DIAGNOSIS — R296 Repeated falls: Secondary | ICD-10-CM | POA: Diagnosis not present

## 2016-06-22 DIAGNOSIS — Z791 Long term (current) use of non-steroidal anti-inflammatories (NSAID): Secondary | ICD-10-CM | POA: Diagnosis not present

## 2016-06-22 DIAGNOSIS — I509 Heart failure, unspecified: Secondary | ICD-10-CM | POA: Diagnosis not present

## 2016-06-22 DIAGNOSIS — M1991 Primary osteoarthritis, unspecified site: Secondary | ICD-10-CM | POA: Diagnosis not present

## 2016-06-22 DIAGNOSIS — Z7982 Long term (current) use of aspirin: Secondary | ICD-10-CM | POA: Diagnosis not present

## 2016-06-22 DIAGNOSIS — F419 Anxiety disorder, unspecified: Secondary | ICD-10-CM | POA: Diagnosis not present

## 2016-06-22 DIAGNOSIS — F329 Major depressive disorder, single episode, unspecified: Secondary | ICD-10-CM | POA: Diagnosis not present

## 2016-06-22 DIAGNOSIS — R296 Repeated falls: Secondary | ICD-10-CM | POA: Diagnosis not present

## 2016-06-22 DIAGNOSIS — R42 Dizziness and giddiness: Secondary | ICD-10-CM | POA: Diagnosis not present

## 2016-06-22 DIAGNOSIS — I4891 Unspecified atrial fibrillation: Secondary | ICD-10-CM | POA: Diagnosis not present

## 2016-06-22 DIAGNOSIS — S8002XD Contusion of left knee, subsequent encounter: Secondary | ICD-10-CM | POA: Diagnosis not present

## 2016-06-22 DIAGNOSIS — N390 Urinary tract infection, site not specified: Secondary | ICD-10-CM | POA: Diagnosis not present

## 2016-06-22 DIAGNOSIS — Z950 Presence of cardiac pacemaker: Secondary | ICD-10-CM | POA: Diagnosis not present

## 2016-06-22 DIAGNOSIS — R2681 Unsteadiness on feet: Secondary | ICD-10-CM | POA: Diagnosis not present

## 2016-06-22 DIAGNOSIS — S51811D Laceration without foreign body of right forearm, subsequent encounter: Secondary | ICD-10-CM | POA: Diagnosis not present

## 2016-06-29 DIAGNOSIS — R296 Repeated falls: Secondary | ICD-10-CM | POA: Diagnosis not present

## 2016-06-29 DIAGNOSIS — R42 Dizziness and giddiness: Secondary | ICD-10-CM | POA: Diagnosis not present

## 2016-07-11 DIAGNOSIS — G4733 Obstructive sleep apnea (adult) (pediatric): Secondary | ICD-10-CM | POA: Diagnosis not present

## 2016-07-13 DIAGNOSIS — M797 Fibromyalgia: Secondary | ICD-10-CM | POA: Diagnosis not present

## 2016-07-13 DIAGNOSIS — R0982 Postnasal drip: Secondary | ICD-10-CM | POA: Diagnosis not present

## 2016-07-13 DIAGNOSIS — F419 Anxiety disorder, unspecified: Secondary | ICD-10-CM | POA: Diagnosis not present

## 2016-07-13 DIAGNOSIS — D696 Thrombocytopenia, unspecified: Secondary | ICD-10-CM | POA: Diagnosis not present

## 2016-07-13 DIAGNOSIS — I2510 Atherosclerotic heart disease of native coronary artery without angina pectoris: Secondary | ICD-10-CM | POA: Diagnosis not present

## 2016-07-13 DIAGNOSIS — E785 Hyperlipidemia, unspecified: Secondary | ICD-10-CM | POA: Diagnosis not present

## 2016-07-14 ENCOUNTER — Inpatient Hospital Stay (HOSPITAL_COMMUNITY): Payer: PPO | Admitting: Anesthesiology

## 2016-07-14 ENCOUNTER — Encounter (HOSPITAL_COMMUNITY): Admission: EM | Disposition: A | Discharge status: Skilled Nursing Facility | Payer: Self-pay | Source: Home / Self Care

## 2016-07-14 ENCOUNTER — Encounter (HOSPITAL_COMMUNITY): Payer: Self-pay | Admitting: *Deleted

## 2016-07-14 ENCOUNTER — Emergency Department (HOSPITAL_COMMUNITY): Payer: PPO

## 2016-07-14 ENCOUNTER — Inpatient Hospital Stay (HOSPITAL_COMMUNITY)
Admission: EM | Admit: 2016-07-14 | Discharge: 2016-07-21 | DRG: 958 | Disposition: A | Discharge status: Skilled Nursing Facility | Payer: PPO | Attending: General Surgery | Admitting: General Surgery

## 2016-07-14 DIAGNOSIS — I5032 Chronic diastolic (congestive) heart failure: Secondary | ICD-10-CM | POA: Diagnosis present

## 2016-07-14 DIAGNOSIS — S0240CD Maxillary fracture, right side, subsequent encounter for fracture with routine healing: Secondary | ICD-10-CM | POA: Diagnosis not present

## 2016-07-14 DIAGNOSIS — R55 Syncope and collapse: Secondary | ICD-10-CM | POA: Diagnosis not present

## 2016-07-14 DIAGNOSIS — I25119 Atherosclerotic heart disease of native coronary artery with unspecified angina pectoris: Secondary | ICD-10-CM | POA: Diagnosis present

## 2016-07-14 DIAGNOSIS — B963 Hemophilus influenzae [H. influenzae] as the cause of diseases classified elsewhere: Secondary | ICD-10-CM | POA: Diagnosis not present

## 2016-07-14 DIAGNOSIS — Z6829 Body mass index (BMI) 29.0-29.9, adult: Secondary | ICD-10-CM

## 2016-07-14 DIAGNOSIS — Z87820 Personal history of traumatic brain injury: Secondary | ICD-10-CM | POA: Diagnosis not present

## 2016-07-14 DIAGNOSIS — S52591A Other fractures of lower end of right radius, initial encounter for closed fracture: Secondary | ICD-10-CM | POA: Diagnosis not present

## 2016-07-14 DIAGNOSIS — Z8249 Family history of ischemic heart disease and other diseases of the circulatory system: Secondary | ICD-10-CM | POA: Diagnosis not present

## 2016-07-14 DIAGNOSIS — S299XXA Unspecified injury of thorax, initial encounter: Secondary | ICD-10-CM | POA: Diagnosis not present

## 2016-07-14 DIAGNOSIS — S069X9A Unspecified intracranial injury with loss of consciousness of unspecified duration, initial encounter: Secondary | ICD-10-CM | POA: Diagnosis present

## 2016-07-14 DIAGNOSIS — R079 Chest pain, unspecified: Secondary | ICD-10-CM | POA: Diagnosis not present

## 2016-07-14 DIAGNOSIS — R05 Cough: Secondary | ICD-10-CM | POA: Diagnosis not present

## 2016-07-14 DIAGNOSIS — E785 Hyperlipidemia, unspecified: Secondary | ICD-10-CM | POA: Diagnosis not present

## 2016-07-14 DIAGNOSIS — S0240CA Maxillary fracture, right side, initial encounter for closed fracture: Secondary | ICD-10-CM | POA: Diagnosis not present

## 2016-07-14 DIAGNOSIS — Z974 Presence of external hearing-aid: Secondary | ICD-10-CM | POA: Diagnosis not present

## 2016-07-14 DIAGNOSIS — K5900 Constipation, unspecified: Secondary | ICD-10-CM | POA: Diagnosis not present

## 2016-07-14 DIAGNOSIS — R0989 Other specified symptoms and signs involving the circulatory and respiratory systems: Secondary | ICD-10-CM

## 2016-07-14 DIAGNOSIS — I480 Paroxysmal atrial fibrillation: Secondary | ICD-10-CM | POA: Diagnosis not present

## 2016-07-14 DIAGNOSIS — I951 Orthostatic hypotension: Secondary | ICD-10-CM | POA: Diagnosis not present

## 2016-07-14 DIAGNOSIS — H9190 Unspecified hearing loss, unspecified ear: Secondary | ICD-10-CM | POA: Diagnosis not present

## 2016-07-14 DIAGNOSIS — Z880 Allergy status to penicillin: Secondary | ICD-10-CM

## 2016-07-14 DIAGNOSIS — S52591B Other fractures of lower end of right radius, initial encounter for open fracture type I or II: Secondary | ICD-10-CM | POA: Diagnosis not present

## 2016-07-14 DIAGNOSIS — E669 Obesity, unspecified: Secondary | ICD-10-CM | POA: Diagnosis present

## 2016-07-14 DIAGNOSIS — Z79899 Other long term (current) drug therapy: Secondary | ICD-10-CM

## 2016-07-14 DIAGNOSIS — Y92481 Parking lot as the place of occurrence of the external cause: Secondary | ICD-10-CM

## 2016-07-14 DIAGNOSIS — G8911 Acute pain due to trauma: Secondary | ICD-10-CM | POA: Diagnosis not present

## 2016-07-14 DIAGNOSIS — S199XXA Unspecified injury of neck, initial encounter: Secondary | ICD-10-CM | POA: Diagnosis not present

## 2016-07-14 DIAGNOSIS — S52691B Other fracture of lower end of right ulna, initial encounter for open fracture type I or II: Secondary | ICD-10-CM | POA: Diagnosis not present

## 2016-07-14 DIAGNOSIS — S52201A Unspecified fracture of shaft of right ulna, initial encounter for closed fracture: Secondary | ICD-10-CM | POA: Diagnosis not present

## 2016-07-14 DIAGNOSIS — S62101A Fracture of unspecified carpal bone, right wrist, initial encounter for closed fracture: Secondary | ICD-10-CM | POA: Diagnosis not present

## 2016-07-14 DIAGNOSIS — I611 Nontraumatic intracerebral hemorrhage in hemisphere, cortical: Secondary | ICD-10-CM

## 2016-07-14 DIAGNOSIS — Z91040 Latex allergy status: Secondary | ICD-10-CM

## 2016-07-14 DIAGNOSIS — S61411A Laceration without foreign body of right hand, initial encounter: Secondary | ICD-10-CM | POA: Diagnosis not present

## 2016-07-14 DIAGNOSIS — Z950 Presence of cardiac pacemaker: Secondary | ICD-10-CM

## 2016-07-14 DIAGNOSIS — G4733 Obstructive sleep apnea (adult) (pediatric): Secondary | ICD-10-CM | POA: Diagnosis not present

## 2016-07-14 DIAGNOSIS — M797 Fibromyalgia: Secondary | ICD-10-CM | POA: Diagnosis present

## 2016-07-14 DIAGNOSIS — Z91013 Allergy to seafood: Secondary | ICD-10-CM

## 2016-07-14 DIAGNOSIS — S02401A Maxillary fracture, unspecified, initial encounter for closed fracture: Secondary | ICD-10-CM | POA: Diagnosis not present

## 2016-07-14 DIAGNOSIS — S52571B Other intraarticular fracture of lower end of right radius, initial encounter for open fracture type I or II: Secondary | ICD-10-CM | POA: Diagnosis present

## 2016-07-14 DIAGNOSIS — R509 Fever, unspecified: Secondary | ICD-10-CM | POA: Diagnosis not present

## 2016-07-14 DIAGNOSIS — Z888 Allergy status to other drugs, medicaments and biological substances status: Secondary | ICD-10-CM

## 2016-07-14 DIAGNOSIS — I4519 Other right bundle-branch block: Secondary | ICD-10-CM | POA: Diagnosis present

## 2016-07-14 DIAGNOSIS — S0231XA Fracture of orbital floor, right side, initial encounter for closed fracture: Secondary | ICD-10-CM | POA: Diagnosis not present

## 2016-07-14 DIAGNOSIS — S06899A Other specified intracranial injury with loss of consciousness of unspecified duration, initial encounter: Secondary | ICD-10-CM | POA: Diagnosis not present

## 2016-07-14 DIAGNOSIS — S06360A Traumatic hemorrhage of cerebrum, unspecified, without loss of consciousness, initial encounter: Secondary | ICD-10-CM | POA: Diagnosis not present

## 2016-07-14 DIAGNOSIS — S069X0A Unspecified intracranial injury without loss of consciousness, initial encounter: Secondary | ICD-10-CM | POA: Diagnosis not present

## 2016-07-14 DIAGNOSIS — S52351A Displaced comminuted fracture of shaft of radius, right arm, initial encounter for closed fracture: Secondary | ICD-10-CM | POA: Diagnosis not present

## 2016-07-14 DIAGNOSIS — S52601A Unspecified fracture of lower end of right ulna, initial encounter for closed fracture: Secondary | ICD-10-CM | POA: Diagnosis not present

## 2016-07-14 DIAGNOSIS — S0993XA Unspecified injury of face, initial encounter: Secondary | ICD-10-CM | POA: Diagnosis not present

## 2016-07-14 DIAGNOSIS — Z9049 Acquired absence of other specified parts of digestive tract: Secondary | ICD-10-CM

## 2016-07-14 DIAGNOSIS — I479 Paroxysmal tachycardia, unspecified: Secondary | ICD-10-CM | POA: Diagnosis not present

## 2016-07-14 DIAGNOSIS — I452 Bifascicular block: Secondary | ICD-10-CM | POA: Diagnosis present

## 2016-07-14 DIAGNOSIS — S0990XA Unspecified injury of head, initial encounter: Secondary | ICD-10-CM | POA: Diagnosis not present

## 2016-07-14 DIAGNOSIS — S52301D Unspecified fracture of shaft of right radius, subsequent encounter for closed fracture with routine healing: Secondary | ICD-10-CM | POA: Diagnosis not present

## 2016-07-14 DIAGNOSIS — S06340A Traumatic hemorrhage of right cerebrum without loss of consciousness, initial encounter: Secondary | ICD-10-CM | POA: Diagnosis not present

## 2016-07-14 DIAGNOSIS — S52699D Other fracture of lower end of unspecified ulna, subsequent encounter for closed fracture with routine healing: Secondary | ICD-10-CM | POA: Diagnosis not present

## 2016-07-14 DIAGNOSIS — S62101B Fracture of unspecified carpal bone, right wrist, initial encounter for open fracture: Secondary | ICD-10-CM

## 2016-07-14 DIAGNOSIS — S066X0A Traumatic subarachnoid hemorrhage without loss of consciousness, initial encounter: Secondary | ICD-10-CM | POA: Diagnosis not present

## 2016-07-14 DIAGNOSIS — S069XAA Unspecified intracranial injury with loss of consciousness status unknown, initial encounter: Secondary | ICD-10-CM | POA: Diagnosis present

## 2016-07-14 DIAGNOSIS — Z7982 Long term (current) use of aspirin: Secondary | ICD-10-CM

## 2016-07-14 DIAGNOSIS — S0231XD Fracture of orbital floor, right side, subsequent encounter for fracture with routine healing: Secondary | ICD-10-CM | POA: Diagnosis not present

## 2016-07-14 DIAGNOSIS — S0292XA Unspecified fracture of facial bones, initial encounter for closed fracture: Secondary | ICD-10-CM

## 2016-07-14 DIAGNOSIS — S06369A Traumatic hemorrhage of cerebrum, unspecified, with loss of consciousness of unspecified duration, initial encounter: Secondary | ICD-10-CM | POA: Diagnosis not present

## 2016-07-14 DIAGNOSIS — T148XXA Other injury of unspecified body region, initial encounter: Secondary | ICD-10-CM | POA: Diagnosis not present

## 2016-07-14 HISTORY — PX: ORIF ULNAR FRACTURE: SHX5417

## 2016-07-14 HISTORY — PX: OPEN REDUCTION INTERNAL FIXATION (ORIF) DISTAL RADIAL FRACTURE: SHX5989

## 2016-07-14 LAB — CBC WITH DIFFERENTIAL/PLATELET
Basophils Absolute: 0 10*3/uL (ref 0.0–0.1)
Basophils Relative: 0 %
Eosinophils Absolute: 0 10*3/uL (ref 0.0–0.7)
Eosinophils Relative: 0 %
HCT: 39.7 % (ref 36.0–46.0)
Hemoglobin: 12.9 g/dL (ref 12.0–15.0)
Lymphocytes Relative: 18 %
Lymphs Abs: 1.2 10*3/uL (ref 0.7–4.0)
MCH: 29.7 pg (ref 26.0–34.0)
MCHC: 32.5 g/dL (ref 30.0–36.0)
MCV: 91.5 fL (ref 78.0–100.0)
Monocytes Absolute: 0.7 10*3/uL (ref 0.1–1.0)
Monocytes Relative: 10 %
Neutro Abs: 4.9 10*3/uL (ref 1.7–7.7)
Neutrophils Relative %: 72 %
Platelets: 103 10*3/uL — ABNORMAL LOW (ref 150–400)
RBC: 4.34 MIL/uL (ref 3.87–5.11)
RDW: 13.2 % (ref 11.5–15.5)
WBC: 6.8 10*3/uL (ref 4.0–10.5)

## 2016-07-14 LAB — BASIC METABOLIC PANEL WITH GFR
Anion gap: 6 (ref 5–15)
BUN: 20 mg/dL (ref 6–20)
CO2: 25 mmol/L (ref 22–32)
Calcium: 8.8 mg/dL — ABNORMAL LOW (ref 8.9–10.3)
Chloride: 108 mmol/L (ref 101–111)
Creatinine, Ser: 0.83 mg/dL (ref 0.44–1.00)
GFR calc Af Amer: >60 mL/min
GFR calc non Af Amer: >60 mL/min
Glucose, Bld: 103 mg/dL — ABNORMAL HIGH (ref 65–99)
Potassium: 4.6 mmol/L (ref 3.5–5.1)
Sodium: 139 mmol/L (ref 135–145)

## 2016-07-14 LAB — PROTIME-INR
INR: 1.09
Prothrombin Time: 14.2 seconds (ref 11.4–15.2)

## 2016-07-14 SURGERY — OPEN REDUCTION INTERNAL FIXATION (ORIF) DISTAL RADIUS FRACTURE
Anesthesia: Monitor Anesthesia Care | Site: Wrist | Laterality: Right

## 2016-07-14 MED ORDER — FENTANYL CITRATE (PF) 100 MCG/2ML IJ SOLN
50.0000 ug | Freq: Once | INTRAMUSCULAR | Status: AC
Start: 2016-07-14 — End: 2016-07-14
  Administered 2016-07-14: 50 ug via INTRAVENOUS

## 2016-07-14 MED ORDER — SODIUM CHLORIDE 0.9 % IV SOLN
INTRAVENOUS | Status: DC
  Administered 2016-07-14: 23:00:00 via INTRAVENOUS

## 2016-07-14 MED ORDER — MORPHINE SULFATE (PF) 4 MG/ML IV SOLN
4.0000 mg | INTRAVENOUS | Status: DC | PRN
  Administered 2016-07-14 (×2): 4 mg via INTRAVENOUS
  Filled 2016-07-14 (×2): qty 1

## 2016-07-14 MED ORDER — CLINDAMYCIN PHOSPHATE 600 MG/50ML IV SOLN
INTRAVENOUS | Status: AC
  Filled 2016-07-14: qty 50

## 2016-07-14 MED ORDER — PANTOPRAZOLE SODIUM 40 MG IV SOLR
40.0000 mg | Freq: Every day | INTRAVENOUS | Status: DC
  Administered 2016-07-14: 40 mg via INTRAVENOUS
  Filled 2016-07-14: qty 40

## 2016-07-14 MED ORDER — PROPOFOL 1000 MG/100ML IV EMUL
INTRAVENOUS | Status: AC
  Filled 2016-07-14: qty 100

## 2016-07-14 MED ORDER — FENTANYL CITRATE (PF) 100 MCG/2ML IJ SOLN
INTRAMUSCULAR | Status: AC
  Administered 2016-07-14: 50 ug via INTRAVENOUS
  Filled 2016-07-14: qty 2

## 2016-07-14 MED ORDER — BUPIVACAINE HCL (PF) 0.25 % IJ SOLN
INTRAMUSCULAR | Status: AC
  Filled 2016-07-14: qty 30

## 2016-07-14 MED ORDER — 0.9 % SODIUM CHLORIDE (POUR BTL) OPTIME
TOPICAL | Status: DC | PRN
  Administered 2016-07-14: 1000 mL

## 2016-07-14 MED ORDER — ONDANSETRON HCL 4 MG/2ML IJ SOLN
4.0000 mg | Freq: Once | INTRAMUSCULAR | Status: AC
  Administered 2016-07-14: 4 mg via INTRAVENOUS
  Filled 2016-07-14: qty 2

## 2016-07-14 MED ORDER — FENTANYL CITRATE (PF) 100 MCG/2ML IJ SOLN
INTRAMUSCULAR | Status: AC
  Filled 2016-07-14: qty 2

## 2016-07-14 MED ORDER — PANTOPRAZOLE SODIUM 40 MG PO TBEC
40.0000 mg | DELAYED_RELEASE_TABLET | Freq: Every day | ORAL | Status: DC
  Administered 2016-07-15 – 2016-07-21 (×7): 40 mg via ORAL
  Filled 2016-07-14 (×7): qty 1

## 2016-07-14 MED ORDER — CHLORHEXIDINE GLUCONATE 4 % EX LIQD
60.0000 mL | Freq: Once | CUTANEOUS | Status: DC
  Filled 2016-07-14: qty 60

## 2016-07-14 MED ORDER — ONDANSETRON HCL 4 MG/2ML IJ SOLN: 4.0000 mg | Freq: Four times a day (QID) | INTRAMUSCULAR | Status: DC | PRN

## 2016-07-14 MED ORDER — PROPOFOL 10 MG/ML IV BOLUS
INTRAVENOUS | Status: DC | PRN
  Administered 2016-07-14: 10 mg via INTRAVENOUS
  Administered 2016-07-14 (×2): 15 mg via INTRAVENOUS

## 2016-07-14 MED ORDER — ONDANSETRON HCL 4 MG PO TABS: 4.0000 mg | ORAL_TABLET | Freq: Four times a day (QID) | ORAL | Status: DC | PRN

## 2016-07-14 MED ORDER — BUPIVACAINE-EPINEPHRINE (PF) 0.5% -1:200000 IJ SOLN
INTRAMUSCULAR | Status: DC | PRN
  Administered 2016-07-14: 30 mL via PERINEURAL

## 2016-07-14 MED ORDER — PROPOFOL 500 MG/50ML IV EMUL
INTRAVENOUS | Status: DC | PRN
  Administered 2016-07-14: 30 ug/kg/min via INTRAVENOUS

## 2016-07-14 MED ORDER — CLINDAMYCIN PHOSPHATE 300 MG/50ML IV SOLN
300.0000 mg | Freq: Four times a day (QID) | INTRAVENOUS | Status: AC
  Administered 2016-07-14 – 2016-07-15 (×3): 300 mg via INTRAVENOUS
  Filled 2016-07-14 (×3): qty 50

## 2016-07-14 MED ORDER — PHENYLEPHRINE HCL 10 MG/ML IJ SOLN
INTRAVENOUS | Status: DC | PRN
  Administered 2016-07-14: 20 ug/min via INTRAVENOUS

## 2016-07-14 MED ORDER — CLINDAMYCIN PHOSPHATE 900 MG/50ML IV SOLN
900.0000 mg | INTRAVENOUS | Status: AC
  Administered 2016-07-14: 300 mg via INTRAVENOUS

## 2016-07-14 MED ORDER — CLINDAMYCIN PHOSPHATE 600 MG/50ML IV SOLN
600.0000 mg | Freq: Once | INTRAVENOUS | Status: AC
  Administered 2016-07-14: 600 mg via INTRAVENOUS
  Filled 2016-07-14: qty 50

## 2016-07-14 MED ORDER — FENTANYL CITRATE (PF) 100 MCG/2ML IJ SOLN: 25.0000 ug | INTRAMUSCULAR | Status: DC | PRN

## 2016-07-14 MED ORDER — CLINDAMYCIN PHOSPHATE 300 MG/2ML IJ SOLN
300.0000 mg | Freq: Four times a day (QID) | INTRAMUSCULAR | Status: DC
  Filled 2016-07-14 (×2): qty 2

## 2016-07-14 MED ORDER — SODIUM CHLORIDE 0.9 % IV SOLN
INTRAVENOUS | Status: DC
  Administered 2016-07-14: 14:00:00 via INTRAVENOUS

## 2016-07-14 MED ORDER — FENTANYL CITRATE (PF) 100 MCG/2ML IJ SOLN
100.0000 ug | Freq: Once | INTRAMUSCULAR | Status: AC
  Administered 2016-07-14: 100 ug via INTRAVENOUS

## 2016-07-14 MED ORDER — LACTATED RINGERS IV SOLN
INTRAVENOUS | Status: DC
  Administered 2016-07-14: 18:00:00 via INTRAVENOUS

## 2016-07-14 MED ORDER — PROPOFOL 10 MG/ML IV BOLUS
INTRAVENOUS | Status: AC
  Filled 2016-07-14: qty 20

## 2016-07-14 SURGICAL SUPPLY — 75 items
BANDAGE ACE 3X5.8 VEL STRL LF (GAUZE/BANDAGES/DRESSINGS) ×6 IMPLANT
BANDAGE ACE 4X5 VEL STRL LF (GAUZE/BANDAGES/DRESSINGS) ×3 IMPLANT
BANDAGE ELASTIC 3 VELCRO ST LF (GAUZE/BANDAGES/DRESSINGS) ×3 IMPLANT
BIT DRILL 2.2 SS TIBIAL (BIT) ×3 IMPLANT
BLADE CLIPPER SURG (BLADE) IMPLANT
BNDG ESMARK 4X9 LF (GAUZE/BANDAGES/DRESSINGS) ×3 IMPLANT
BNDG GAUZE ELAST 4 BULKY (GAUZE/BANDAGES/DRESSINGS) ×3 IMPLANT
CANISTER SUCT 3000ML PPV (MISCELLANEOUS) ×3 IMPLANT
CLOSURE WOUND 1/2 X4 (GAUZE/BANDAGES/DRESSINGS)
CORDS BIPOLAR (ELECTRODE) ×3 IMPLANT
COVER SURGICAL LIGHT HANDLE (MISCELLANEOUS) ×3 IMPLANT
CUFF TOURNIQUET SINGLE 18IN (TOURNIQUET CUFF) ×3 IMPLANT
CUFF TOURNIQUET SINGLE 24IN (TOURNIQUET CUFF) IMPLANT
DECANTER SPIKE VIAL GLASS SM (MISCELLANEOUS) IMPLANT
DRAIN TLS ROUND 10FR (DRAIN) IMPLANT
DRAPE OEC MINIVIEW 54X84 (DRAPES) ×3 IMPLANT
DRAPE SURG 17X11 SM STRL (DRAPES) ×3 IMPLANT
DRSG ADAPTIC 3X8 NADH LF (GAUZE/BANDAGES/DRESSINGS) IMPLANT
ELECT REM PT RETURN 9FT ADLT (ELECTROSURGICAL)
ELECTRODE REM PT RTRN 9FT ADLT (ELECTROSURGICAL) IMPLANT
GAUZE SPONGE 4X4 12PLY STRL (GAUZE/BANDAGES/DRESSINGS) IMPLANT
GAUZE SPONGE 4X4 16PLY XRAY LF (GAUZE/BANDAGES/DRESSINGS) ×3 IMPLANT
GAUZE XEROFORM 5X9 LF (GAUZE/BANDAGES/DRESSINGS) ×6 IMPLANT
GLOVE BIOGEL PI IND STRL 8.5 (GLOVE) ×1 IMPLANT
GLOVE BIOGEL PI INDICATOR 8.5 (GLOVE) ×2
GLOVE SURG ORTHO 8.0 STRL STRW (GLOVE) ×3 IMPLANT
GOWN STRL REUS W/ TWL LRG LVL3 (GOWN DISPOSABLE) ×3 IMPLANT
GOWN STRL REUS W/ TWL XL LVL3 (GOWN DISPOSABLE) ×1 IMPLANT
GOWN STRL REUS W/TWL LRG LVL3 (GOWN DISPOSABLE) ×6
GOWN STRL REUS W/TWL XL LVL3 (GOWN DISPOSABLE) ×2
K-WIRE 1.6 (WIRE) ×2
K-WIRE FX5X1.6XNS BN SS (WIRE) ×1
KIT BASIN OR (CUSTOM PROCEDURE TRAY) ×3 IMPLANT
KIT ROOM TURNOVER OR (KITS) ×3 IMPLANT
KWIRE FX5X1.6XNS BN SS (WIRE) ×1 IMPLANT
MANIFOLD NEPTUNE II (INSTRUMENTS) ×3 IMPLANT
NEEDLE HYPO 25X1 1.5 SAFETY (NEEDLE) ×3 IMPLANT
NS IRRIG 1000ML POUR BTL (IV SOLUTION) ×3 IMPLANT
PACK ORTHO EXTREMITY (CUSTOM PROCEDURE TRAY) ×3 IMPLANT
PAD ARMBOARD 7.5X6 YLW CONV (MISCELLANEOUS) ×12 IMPLANT
PAD CAST 4YDX4 CTTN HI CHSV (CAST SUPPLIES) IMPLANT
PADDING CAST COTTON 4X4 STRL (CAST SUPPLIES)
PADDING CAST COTTON 6X4 STRL (CAST SUPPLIES) ×6 IMPLANT
PEG LOCKING SMOOTH 2.2X20 (Screw) ×3 IMPLANT
PEG LOCKING SMOOTH 2.2X22 (Screw) ×9 IMPLANT
PLATE NARROW DVR RIGHT (Plate) ×3 IMPLANT
SCREW LOCK 12X2.7X 3 LD (Screw) ×2 IMPLANT
SCREW LOCK 14X2.7X 3 LD TPR (Screw) ×2 IMPLANT
SCREW LOCK 16X2.7X 3 LD TPR (Screw) ×1 IMPLANT
SCREW LOCK 18X2.7X 3 LD TPR (Screw) ×1 IMPLANT
SCREW LOCKING 2.7X12MM (Screw) ×4 IMPLANT
SCREW LOCKING 2.7X13MM (Screw) ×3 IMPLANT
SCREW LOCKING 2.7X14 (Screw) ×4 IMPLANT
SCREW LOCKING 2.7X16 (Screw) ×2 IMPLANT
SCREW LOCKING 2.7X18 (Screw) ×2 IMPLANT
SOAP 2 % CHG 4 OZ (WOUND CARE) ×3 IMPLANT
SOL PREP POV-IOD 4OZ 10% (MISCELLANEOUS) ×3 IMPLANT
SPONGE GAUZE 4X4 12PLY STER LF (GAUZE/BANDAGES/DRESSINGS) ×3 IMPLANT
SPONGE LAP 4X18 X RAY DECT (DISPOSABLE) ×3 IMPLANT
STRIP CLOSURE SKIN 1/2X4 (GAUZE/BANDAGES/DRESSINGS) IMPLANT
SUCTION FRAZIER HANDLE 10FR (MISCELLANEOUS) ×2
SUCTION TUBE FRAZIER 10FR DISP (MISCELLANEOUS) ×1 IMPLANT
SUT MNCRL AB 4-0 PS2 18 (SUTURE) ×3 IMPLANT
SUT PROLENE 3 0 PS 1 (SUTURE) ×12 IMPLANT
SUT PROLENE 4 0 PS 2 18 (SUTURE) ×6 IMPLANT
SUT VIC AB 2-0 FS1 27 (SUTURE) IMPLANT
SUT VICRYL 4-0 PS2 18IN ABS (SUTURE) IMPLANT
SYR CONTROL 10ML LL (SYRINGE) IMPLANT
SYSTEM CHEST DRAIN TLS 7FR (DRAIN) IMPLANT
TOWEL OR 17X24 6PK STRL BLUE (TOWEL DISPOSABLE) ×3 IMPLANT
TOWEL OR 17X26 10 PK STRL BLUE (TOWEL DISPOSABLE) ×3 IMPLANT
TUBE CONNECTING 12'X1/4 (SUCTIONS) ×1
TUBE CONNECTING 12X1/4 (SUCTIONS) ×2 IMPLANT
WATER STERILE IRR 1000ML POUR (IV SOLUTION) ×3 IMPLANT
YANKAUER SUCT BULB TIP NO VENT (SUCTIONS) IMPLANT

## 2016-07-14 NOTE — Brief Op Note (Signed)
ORIF right distal radius fracture Continue with inpatient care as per trauma nwb rue Ice/elevate Ok to move fingers F/u in office 2 weeks post op Contact me directly if questions [phone] [place]
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
## 2016-07-14 NOTE — Anesthesia Preprocedure Evaluation (Signed)
Anesthesia Evaluation  Patient identified by MRN, date of birth, ID band Patient awake    Reviewed: Allergy & Precautions, NPO status , Patient's Chart, lab work & pertinent test results  History of Anesthesia Complications Negative for: history of anesthetic complications  Airway Mallampati: II  TM Distance: >3 FB Neck ROM: Full    Dental  (+) Teeth Intact   Pulmonary sleep apnea ,    breath sounds clear to auscultation       Cardiovascular + CAD and +CHF  + dysrhythmias  Rhythm:Irregular     Neuro/Psych negative neurological ROS  negative psych ROS   GI/Hepatic negative GI ROS, Neg liver ROS,   Endo/Other  negative endocrine ROS  Renal/GU negative Renal ROS     Musculoskeletal   Abdominal   Peds  Hematology   Anesthesia Other Findings   Reproductive/Obstetrics                             Anesthesia Physical Anesthesia Plan  ASA: III  Anesthesia Plan: MAC and Regional   Post-op Pain Management:    Induction:   Airway Management Planned: Natural Airway, Nasal Cannula and Simple Face Mask  Additional Equipment: None  Intra-op Plan:   Post-operative Plan:   Informed Consent: I have reviewed the patients History and Physical, chart, labs and discussed the procedure including the risks, benefits and alternatives for the proposed anesthesia with the patient or authorized representative who has indicated his/her understanding and acceptance.   Dental advisory given  Plan Discussed with: CRNA and Surgeon  Anesthesia Plan Comments:         Anesthesia Quick Evaluation

## 2016-07-14 NOTE — Anesthesia Procedure Notes (Signed)
Anesthesia Regional Block: Axillary brachial plexus block   Pre-Anesthetic Checklist: ,, timeout performed, Correct Patient, Correct Site, Correct Laterality, Correct Procedure, Correct Position, site marked, Risks and benefits discussed,  Surgical consent,  Pre-op evaluation,  At surgeon's request and post-op pain management  Laterality: Upper and Right  Prep: chloraprep       Needles:  Injection technique: Single-shot  Needle Type: Echogenic Needle          Additional Needles:   Procedures: ultrasound guided,,,,,,,,  Narrative:  Start time: 07/14/2016 5:47 PM End time: 07/14/2016 5:53 PM Injection made incrementally with aspirations every 5 mL.  Performed by: Personally   Additional Notes: H+P and labs reviewed, risks and benefits discussed with patient, procedure tolerated well without complications

## 2016-07-14 NOTE — ED Notes (Signed)
Pt transported to OR holding with daughter. Pt had hearing aides in so that she could speak to surgeon. OR nurse made aware

## 2016-07-14 NOTE — ED Notes (Signed)
Pt O2 de-sat to 86% while asleep. Family states Pt wears CPAP at home. Placed on 2L O2 nasal canula.

## 2016-07-14 NOTE — ED Notes (Signed)
Pt received 156mcg of fentanyl en route

## 2016-07-14 NOTE — Transfer of Care (Signed)
Immediate Anesthesia Transfer of Care Note  Patient: Katherine Long  Procedure(s) Performed: Procedure(s): OPEN REDUCTION INTERNAL FIXATION (ORIF) DISTAL RADIAL FRACTURE (Right) OPEN REDUCTION INTERNAL FIXATION (ORIF) ULNAR FRACTURE (Right)  Patient Location: PACU  Anesthesia Type:MAC combined with regional for post-op pain  Level of Consciousness: awake, alert  and patient cooperative  Airway & Oxygen Therapy: Patient Spontanous Breathing  Post-op Assessment: Report given to RN and Post -op Vital signs reviewed and stable  Post vital signs: Reviewed and stable  Last Vitals:  Vitals:   07/14/16 1630 07/14/16 1645  BP: 113/64 106/66  Pulse: (!) 59 61  Resp: 20 17  Temp:      Last Pain:  Vitals:   07/14/16 1613  TempSrc:   PainSc: 5          Complications: No apparent anesthesia complications
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
## 2016-07-14 NOTE — Anesthesia Postprocedure Evaluation (Signed)
Anesthesia Post Note  Patient: Katherine Long  Procedure(s) Performed: Procedure(s) (LRB): OPEN REDUCTION INTERNAL FIXATION (ORIF) DISTAL RADIAL FRACTURE (Right) OPEN REDUCTION INTERNAL FIXATION (ORIF) ULNAR FRACTURE (Right)  Patient location during evaluation: PACU Anesthesia Type: Regional and MAC Level of consciousness: awake and alert Pain management: pain level controlled Vital Signs Assessment: post-procedure vital signs reviewed and stable Respiratory status: spontaneous breathing, nonlabored ventilation, respiratory function stable and patient connected to nasal cannula oxygen Cardiovascular status: blood pressure returned to baseline and stable Postop Assessment: no signs of nausea or vomiting Anesthetic complications: no       Last Vitals:  Vitals:   07/14/16 2200 07/14/16 2300  BP: 126/71 119/67  Pulse: 62 61  Resp: 14 (!) 9  Temp:      Last Pain:  Vitals:   07/14/16 1613  TempSrc:   PainSc: 5                  [name] DAVID

## 2016-07-15 ENCOUNTER — Inpatient Hospital Stay (HOSPITAL_COMMUNITY): Payer: PPO

## 2016-07-15 LAB — BASIC METABOLIC PANEL
Anion gap: 10 (ref 5–15)
BUN: 14 mg/dL (ref 6–20)
CHLORIDE: 105 mmol/L (ref 101–111)
CO2: 25 mmol/L (ref 22–32)
CREATININE: 0.71 mg/dL (ref 0.44–1.00)
Calcium: 8.5 mg/dL — ABNORMAL LOW (ref 8.9–10.3)
GFR calc Af Amer: >60 mL/min (ref >60)
GFR calc non Af Amer: >60 mL/min (ref >60)
Glucose, Bld: 94 mg/dL (ref 65–99)
Potassium: 4 mmol/L (ref 3.5–5.1)
SODIUM: 140 mmol/L (ref 135–145)

## 2016-07-15 LAB — CBC
HEMATOCRIT: 37.5 % (ref 36.0–46.0)
Hemoglobin: 12 g/dL (ref 12.0–15.0)
MCH: 30 pg (ref 26.0–34.0)
MCHC: 32 g/dL (ref 30.0–36.0)
MCV: 93.8 fL (ref 78.0–100.0)
Platelets: 86 10*3/uL — ABNORMAL LOW (ref 150–400)
RBC: 4 MIL/uL (ref 3.87–5.11)
RDW: 13.1 % (ref 11.5–15.5)
WBC: 6.8 10*3/uL (ref 4.0–10.5)

## 2016-07-15 LAB — MRSA PCR SCREENING: MRSA BY PCR: NEGATIVE

## 2016-07-15 MED ORDER — OXYCODONE-ACETAMINOPHEN 5-325 MG PO TABS
1.0000 | ORAL_TABLET | ORAL | Status: DC | PRN
  Administered 2016-07-15 – 2016-07-20 (×22): 1 via ORAL
  Filled 2016-07-15 (×22): qty 1

## 2016-07-15 MED ORDER — FUROSEMIDE 20 MG PO TABS
20.0000 mg | ORAL_TABLET | ORAL | Status: DC
  Administered 2016-07-15: 20 mg via ORAL
  Filled 2016-07-15: qty 1

## 2016-07-15 MED ORDER — VENLAFAXINE HCL ER 75 MG PO CP24
150.0000 mg | ORAL_CAPSULE | Freq: Every day | ORAL | Status: DC
  Administered 2016-07-15 – 2016-07-21 (×7): 150 mg via ORAL
  Filled 2016-07-15 (×7): qty 2

## 2016-07-15 MED ORDER — NITROGLYCERIN 0.4 MG SL SUBL: 0.4000 mg | SUBLINGUAL_TABLET | SUBLINGUAL | Status: DC | PRN

## 2016-07-15 MED ORDER — CYCLOBENZAPRINE HCL 10 MG PO TABS
10.0000 mg | ORAL_TABLET | Freq: Three times a day (TID) | ORAL | Status: DC | PRN
  Administered 2016-07-15 – 2016-07-20 (×10): 10 mg via ORAL
  Filled 2016-07-15 (×10): qty 1

## 2016-07-15 MED ORDER — BACITRACIN-NEOMYCIN-POLYMYXIN OINTMENT TUBE
TOPICAL_OINTMENT | CUTANEOUS | Status: DC | PRN
  Administered 2016-07-15: 1 via TOPICAL
  Filled 2016-07-15: qty 14.17
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
## 2016-07-15 NOTE — Progress Notes (Signed)
Pt seen and examined. No issues overnight.  EXAM: Temp:  [97.4 F (36.3 C)-98.5 F (36.9 C)] 98.5 F (36.9 C) (02/24 0800) Pulse Rate:  [59-63] 61 (02/24 0820) Resp:  [9-20] 12 (02/24 0820) BP: (95-143)/(57-78) 124/69 (02/24 0820) SpO2:  [92 %-100 %] 98 % (02/24 0820) Weight:  [78.9 kg (174 lb)] 78.9 kg (174 lb) (02/23 1250) Intake/Output      02/23 0701 - 02/24 0700 02/24 0701 - 02/25 0700   I.V. (mL/kg) 1270.8 (16.1)    IV Piggyback 50 50   Total Intake(mL/kg) 1320.8 (16.7) 50 (0.6)   Urine (mL/kg/hr) 525    Blood 5    Total Output 530     Net +790.8 +50         Awake and alert Follows commands throughout Full strength  Repeat CT is stable No role for neurosurgical intervention Ok for prophylactic anticoagulation Feel free to call with questions

## 2016-07-15 NOTE — Progress Notes (Signed)
Orthopedic Tech Progress Note Patient Details:  Katherine Long June 01, 1928 [id number]  Ortho Devices Type of Ortho Device: Sling immobilizer Ortho Device/Splint Interventions: Application   [name] 07/15/2016, 11:52 AM
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
## 2016-07-16 MED ORDER — SODIUM CHLORIDE 0.9 % IV BOLUS (SEPSIS)
1000.0000 mL | Freq: Once | INTRAVENOUS | Status: AC
  Administered 2016-07-16: 1000 mL via INTRAVENOUS
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
## 2016-07-16 NOTE — Progress Notes (Signed)
Trauma Service Note  Subjective: Pain issues overnight, improved currently. Continues to have tachycardia and presyncope on standing. Question about pacer working correctly per family  Objective: Vital signs in last 24 hours: Temp:  [98.7 F (37.1 C)-100.1 F (37.8 C)] 98.7 F (37.1 C) (02/25 0459) Pulse Rate:  [71-91] 71 (02/25 0459) Resp:  [17] 17 (02/25 0459) BP: (103-125)/(49-58) 125/49 (02/25 0459) SpO2:  [96 %] 96 % (02/25 0459) Last BM Date: 07/13/16  Intake/Output from previous day: 02/24 0701 - 02/25 0700 In: 450 [P.O.:340; I.V.:60; IV Piggyback:50] Out: 650 [Urine:650] Intake/Output this shift: Total I/O In: -  Out: 250 [Urine:250]  General: NAD  Lungs: CTAB  Abd: soft, NT, ND  Extremities: right arm in splint  Neuro: AOx4  Lab Results: CBC   Recent Labs  07/14/16 1337 07/15/16 0659  WBC 6.8 6.8  HGB 12.9 12.0  HCT 39.7 37.5  PLT 103* 86*   BMET  Recent Labs  07/14/16 1337 07/15/16 0353  NA 139 140  K 4.6 4.0  CL 108 105  CO2 25 25  GLUCOSE 103* 94  BUN 20 14  CREATININE 0.83 0.71  CALCIUM 8.8* 8.5*   PT/INR  Recent Labs  07/14/16 2049  LABPROT 14.2  INR 1.09   ABG No results for input(s): PHART, HCO3 in the last 72 hours.  Invalid input(s): PCO2, PO2  Studies/Results: No results found.  Anti-infectives: Anti-infectives    Start     Dose/Rate Route Frequency Ordered Stop   07/15/16 0600  clindamycin (CLEOCIN) IVPB 900 mg     900 mg 100 mL/hr over 30 Minutes Intravenous To Surgery 07/14/16 1700 07/14/16 1809   07/14/16 2200  clindamycin (CLEOCIN) injection 300 mg  Status:  Discontinued     300 mg Intravenous Every 6 hours 07/14/16 2030 07/14/16 2044   07/14/16 2200  clindamycin (CLEOCIN) IVPB 300 mg     300 mg 100 mL/hr over 30 Minutes Intravenous Every 6 hours 07/14/16 2044 07/15/16 1234   07/14/16 1808  clindamycin (CLEOCIN) 600 MG/50ML IVPB    Comments:  Schonewitz, Leigh   : cabinet override      07/14/16 1808  07/15/16 0614   07/14/16 1330  clindamycin (CLEOCIN) IVPB 600 mg     600 mg 100 mL/hr over 30 Minutes Intravenous  Once 07/14/16 1326 07/14/16 1410      Medications Scheduled Meds: . furosemide  20 mg Oral QODAY  . pantoprazole  40 mg Oral Daily  . venlafaxine XR  150 mg Oral Daily   Continuous Infusions: . lactated ringers Stopped (07/14/16 1903)   PRN Meds:.cyclobenzaprine, neomycin-bacitracin-polymyxin, nitroGLYCERIN, ondansetron **OR** ondansetron (ZOFRAN) IV, oxyCODONE-acetaminophen  Assessment/Plan: s/p Procedure(s): OPEN REDUCTION INTERNAL FIXATION (ORIF) DISTAL RADIAL FRACTURE OPEN REDUCTION INTERNAL FIXATION (ORIF) ULNAR FRACTURE -EKG now - cards consult if appears to be a pacer issue or if presyncope happens again -continue diet -continue PT/OT   LOS: 2 days   [place] Surgeon [phone] Surgery 07/16/2016
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
## 2016-07-16 NOTE — Progress Notes (Signed)
Patient complaining of pain in right arm and wrist unrelieved by 1 Percocet given at [place] given at 0512, Pt rated pain 6 out of 10 after medication.  Right arm elevated on pillows and ice has been applied to arm throughout night. Pt able to move fingers, +CMS. She denies numbness,tingling. Notified Dr. Ninfa Linden. No new orders.

## 2016-07-17 ENCOUNTER — Inpatient Hospital Stay (HOSPITAL_COMMUNITY): Payer: PPO

## 2016-07-17 ENCOUNTER — Encounter (HOSPITAL_COMMUNITY): Payer: Self-pay | Admitting: Orthopedic Surgery

## 2016-07-17 DIAGNOSIS — S06360A Traumatic hemorrhage of cerebrum, unspecified, without loss of consciousness, initial encounter: Secondary | ICD-10-CM

## 2016-07-17 DIAGNOSIS — R55 Syncope and collapse: Secondary | ICD-10-CM

## 2016-07-17 DIAGNOSIS — I480 Paroxysmal atrial fibrillation: Secondary | ICD-10-CM

## 2016-07-17 DIAGNOSIS — I951 Orthostatic hypotension: Secondary | ICD-10-CM

## 2016-07-17 DIAGNOSIS — Z950 Presence of cardiac pacemaker: Secondary | ICD-10-CM

## 2016-07-17 LAB — ECHOCARDIOGRAM COMPLETE
Height: 64 in
Weight: 2784 oz

## 2016-07-17 MED ORDER — METOPROLOL SUCCINATE ER 25 MG PO TB24
25.0000 mg | ORAL_TABLET | Freq: Every day | ORAL | Status: DC
  Administered 2016-07-17 – 2016-07-21 (×5): 25 mg via ORAL
  Filled 2016-07-17 (×5): qty 1

## 2016-07-17 MED ORDER — SENNOSIDES-DOCUSATE SODIUM 8.6-50 MG PO TABS
1.0000 | ORAL_TABLET | Freq: Two times a day (BID) | ORAL | Status: DC
  Administered 2016-07-17 – 2016-07-21 (×8): 1 via ORAL
  Filled 2016-07-17 (×8): qty 1

## 2016-07-17 MED ORDER — PERFLUTREN LIPID MICROSPHERE
1.0000 mL | INTRAVENOUS | Status: AC | PRN
  Administered 2016-07-17: 3 mL via INTRAVENOUS
  Filled 2016-07-17: qty 10
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
## 2016-07-17 NOTE — Progress Notes (Signed)
Pacer was interrogated.  Pacer is stable but High HR alarm was decreased to no a fib on monitor.  Mode sensing rate was adjusted.  Recommended for EP to review with [employer] rep in AM to see if any other adjustments need to be made.
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
## 2016-07-17 NOTE — Progress Notes (Signed)
  Echocardiogram 2D Echocardiogram with definity has been performed.  Keymari, Maheras M 07/17/2016, 9:26 AM
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
## 2016-07-17 NOTE — Progress Notes (Signed)
  Patient Details:  Katherine Long [birth year] [id number]   The patient is 3 days s/p right distal radius open reduction and internal fixation.   The patient and her family had some questions regarding post-operative bleeding. They state that the patient bled through her bandage and onto a pillow case. Her nurse left the operative bandage on and reinforced it with a new ACE wrap which the patient has had minimal bleeding through. Discussed that due to the bone lacerating the skin posteriorly and having an incision for the ORIF anteriorly, the wounds had to be closed loosely. If there was too much tension on one of the incisions, then we would not have been able to close the other. Advised the family to continue to watch the bandage to see if there is any increase in bleeding throughout the next day. They understood.   Encouraged the patient to continue icing and elevating the arm. She is able to wiggle all of her fingers.   All questions and concerns were encouraged and addressed. If they have any further concerns, they can call our office or have a nurse contact us.    Brynda Peon 07/17/2016, 1:13 PM

## 2016-07-17 NOTE — Progress Notes (Signed)
   Per patient request, Advanced Directive (AD) documentation left at bedside.  If/when patient decides to move forward w/ AD, please page on-call chaplain or contact the Spiritual Care department (between the hours of 9AM - 3PM Mon - Fri).   Will follow, as needed.  - Rev. Chaplain Joanann Mies MDiv ThM 

## 2016-07-18 DIAGNOSIS — S069X0A Unspecified intracranial injury without loss of consciousness, initial encounter: Secondary | ICD-10-CM

## 2016-07-18 DIAGNOSIS — I479 Paroxysmal tachycardia, unspecified: Secondary | ICD-10-CM

## 2016-07-18 MED ORDER — ALUM & MAG HYDROXIDE-SIMETH 200-200-20 MG/5ML PO SUSP
30.0000 mL | Freq: Four times a day (QID) | ORAL | Status: DC | PRN
Start: 2016-07-18 — End: 2016-07-21

## 2016-07-18 MED ORDER — POLYVINYL ALCOHOL 1.4 % OP SOLN
2.0000 [drp] | OPHTHALMIC | Status: DC | PRN
Start: 2016-07-18 — End: 2016-07-21
  Administered 2016-07-18: 2 [drp] via OPHTHALMIC
  Filled 2016-07-18: qty 15

## 2016-07-18 NOTE — Progress Notes (Signed)
Progress Note  Patient Name: Katherine Long Date of Encounter: 07/18/2016  Primary Cardiologist: Dr. Agustin Cree  Subjective   Feels better.  Pacer reprogrammed.  Inpatient Medications    Scheduled Meds: . metoprolol succinate  25 mg Oral Daily  . pantoprazole  40 mg Oral Daily  . senna-docusate  1 tablet Oral BID  . venlafaxine XR  150 mg Oral Daily   Continuous Infusions: . lactated ringers Stopped (07/14/16 1903)   PRN Meds: cyclobenzaprine, neomycin-bacitracin-polymyxin, nitroGLYCERIN, ondansetron **OR** ondansetron (ZOFRAN) IV, oxyCODONE-acetaminophen   Vital Signs    Vitals:   07/17/16 0456 07/17/16 1436 07/17/16 2145 07/18/16 0509  BP: (!) 123/55 (!) 115/58 126/65 (!) 113/58  Pulse: 62 70 75 74  Resp: 18 18 17 17   Temp: 97.9 F (36.6 C) 98.6 F (37 C) 98.7 F (37.1 C) 99 F (37.2 C)  TempSrc: Oral Oral Oral Oral  SpO2: 96% 97% 96% 97%  Weight:      Height:        Intake/Output Summary (Last 24 hours) at 07/18/16 1213 Last data filed at 07/18/16 0900  Gross per 24 hour  Intake              840 ml  Output              750 ml  Net               90 ml   Filed Weights   07/14/16 1250  Weight: 174 lb (78.9 kg)    Telemetry    AV paced - Personally Reviewed  ECG      Physical Exam   GEN: No acute distress.   Neck: No JVD Cardiac: RRR, no murmurs, rubs, or gallops.  Respiratory: Clear to auscultation bilaterally. GI: Soft, nontender, non-distended  MS: No edema; No deformity. Neuro:  Nonfocal  Psych: Normal affect   Labs    Chemistry Recent Labs Lab 07/14/16 1337 07/15/16 0353  NA 139 140  K 4.6 4.0  CL 108 105  CO2 25 25  GLUCOSE 103* 94  BUN 20 14  CREATININE 0.83 0.71  CALCIUM 8.8* 8.5*  GFRNONAA >60 >60  GFRAA >60 >60  ANIONGAP 6 10     Hematology Recent Labs Lab 07/14/16 1337 07/15/16 0659  WBC 6.8 6.8  RBC 4.34 4.00  HGB 12.9 12.0  HCT 39.7 37.5  MCV 91.5 93.8  MCH 29.7 30.0  MCHC 32.5 32.0  RDW 13.2  13.1  PLT 103* 86*    Cardiac EnzymesNo results for input(s): TROPONINI in the last 168 hours. No results for input(s): TROPIPOC in the last 168 hours.   BNPNo results for input(s): BNP, PROBNP in the last 168 hours.   DDimer No results for input(s): DDIMER in the last 168 hours.   Radiology    No results found.  Cardiac Studies   Pacer check noted- no AFib detected by pacer  Patient Profile     83 y.o. female with fall   Assessment & Plan    S/p head trauma with ICH.  No AFib detected by most recent pacer check.  Now AV pacing. NO cardiac indication for  Anticoagulation.  She would not be a candidate regardless.  Tachycardia, treated with metoprolol.  No further tachycardia.  Can increase dose as needed.    Cardiology f/u with Dr. Agustin Cree.  Pacer check report to be taken to his office by [employer] rep today.    Signed, Larae Grooms, MD  07/18/2016, 12:13  PM

## 2016-07-18 NOTE — NC FL2 (Signed)
[place] LEVEL OF CARE SCREENING TOOL     IDENTIFICATION  Patient Name: Katherine Long Birthdate: Mar 19, 1929 Sex: female Admission Date (Current Location): 07/14/2016  [facility] and [place] Number:  Herbalist and Address:  [facility], [place] [address], [place], [place] [address]      Provider Number: [id number]  Attending Physician Name and Address:  Trauma Md, MD  Relative Name and Phone Number:  Dtrs - [phone]    Current Level of Care: Hospital Recommended Level of Care: [place] Prior Approval Number:    Date Approved/Denied:   PASRR Number: [id number]  Discharge Plan: SNF    Current Diagnoses: Patient Active Problem List   Diagnosis Date Noted  . TBI (traumatic brain injury) ([place]) 07/14/2016  . OSA (obstructive sleep apnea) 10/01/2015  . Cough 10/01/2015  . Obesity 10/01/2015  . Bradycardia 10/16/2012  . Heart failure with preserved left ventricular function (HFpEF) ([place]) 10/11/2012  . Chest pain 10/11/2012    Orientation RESPIRATION BLADDER Height & Weight     Self, Time, Situation, Place  Normal Continent Weight: 174 lb (78.9 kg) Height:  5\' 4"  (162.6 cm)  BEHAVIORAL SYMPTOMS/MOOD NEUROLOGICAL BOWEL NUTRITION STATUS      Continent (S) Diet (See DC Summary)  AMBULATORY STATUS COMMUNICATION OF NEEDS Skin   Limited Assist Verbally Normal                       Personal Care Assistance Level of Assistance  Dressing, Bathing Bathing Assistance: Limited assistance   Dressing Assistance: Limited assistance     Functional Limitations Info  Speech, Hearing, Sight Sight Info: Adequate Hearing Info: Adequate Speech Info: Adequate    SPECIAL CARE FACTORS FREQUENCY  PT (By licensed PT), OT (By licensed OT)     PT Frequency: 5x wk OT Frequency: 5x wk            Contractures Contractures Info: Not present    Additional Factors Info  Code Status, Allergies Code  Status Info: Full Allergies Info: Penicillins, Shellfish-derived Products, Latex, Other           Current Medications (07/18/2016):  This is the current hospital active medication list Current Facility-Administered Medications  Medication Dose Route Frequency Provider Last Rate Last Dose  . cyclobenzaprine (FLEXERIL) tablet 10 mg  10 mg Oral Q8H PRN Mickeal Skinner, MD   10 mg at 07/18/16 0443  . lactated ringers infusion   Intravenous Continuous Oleta Mouse, MD   Stopped at 07/14/16 1903  . metoprolol succinate (TOPROL-XL) 24 hr tablet 25 mg  25 mg Oral Daily Isaiah Serge, NP   25 mg at 07/18/16 0908  . neomycin-bacitracin-polymyxin (NEOSPORIN) ointment   Topical PRN Judeth Horn, MD   1 application at [phone]  . nitroGLYCERIN (NITROSTAT) SL tablet 0.4 mg  0.4 mg Sublingual Q5 min PRN Judeth Horn, MD      . ondansetron [facility]) tablet 4 mg  4 mg Oral Q6H PRN Georganna Skeans, MD       Or  . ondansetron [facility]) injection 4 mg  4 mg Intravenous Q6H PRN Georganna Skeans, MD      . oxyCODONE-acetaminophen (PERCOCET/ROXICET) 5-325 MG per tablet 1 tablet  1 tablet Oral Q4H PRN Judeth Horn, MD   1 tablet at 07/18/16 0908  . pantoprazole (PROTONIX) EC tablet 40 mg  40 mg Oral Daily Georganna Skeans, MD   40 mg at  07/18/16 0908  . senna-docusate (Senokot-S) tablet 1 tablet  1 tablet Oral BID Clovis Riley, MD   1 tablet at 07/18/16 0908  . venlafaxine XR (EFFEXOR-XR) 24 hr capsule 150 mg  150 mg Oral Daily Judeth Horn, MD   150 mg at 07/18/16 0908     Discharge Medications: Please see discharge summary for a list of discharge medications.  Relevant Imaging Results:  Relevant Lab Results:   Additional Information [place], Asante Blanda B, LCSWA

## 2016-07-18 NOTE — Care Management Important Message (Signed)
Important Message  Patient Details  Name: Katherine Long MRN: [id number] Date of Birth: [birth year]   Medicare Important Message Given:  Yes    Orbie Pyo 07/18/2016, 2:54 PM

## 2016-07-18 NOTE — Clinical Social Work Note (Signed)
Clinical Social Work Assessment  Patient Details  Name: Katherine Long MRN: [id number] Date of Birth: May 18, 1929  Date of referral:  07/17/16               Reason for consult:  Facility Placement, Trauma                Permission sought to share information with:  Family Supports Permission granted to share information::  Yes, Verbal Permission Granted  Name::     Letitia Neri  Relationship::  Daughter  Contact Information:  [phone]  Housing/Transportation Living arrangements for the past 2 months:  [place] of Information:  Patient, Adult Children Patient Interpreter Needed:  None Criminal Activity/Legal Involvement Pertinent to Current Situation/Hospitalization:  No - Comment as needed Significant Relationships:  Adult Children, Spouse Lives with:  Spouse, Adult Children Do you feel safe going back to the place where you live?  Yes Need for family participation in patient care:  Yes (Comment)  Care giving concerns:  Patient daughter at bedside and states that patient spouse is 75 and patient is the primary caregiver in the home.  Patient daughter is familiar with SNF process and feels that patient will make good improvements and be able to return home.   Social Worker assessment / plan:  Holiday representative met with patient and patient spouse at bedside to offer support and discuss patient needs at discharge.  Patient states that she lives at home with her spouse and at times her daughter, Dondra Spry comes to stay with them.  Patient has been to SNF for rehab in the past and is agreeable to return.  Patient was at [facility] and [facility] and would prefer to go to [facility].  CSW to initiate bed search and follow up with patient and family regarding available bed offers.  CSW will update insurance company in order to obtain insurance authorization prior to discharge.  CSW remains available for support and to facilitate patient discharge needs once  medically stable.  Employment status:  Retired Nurse, adult PT Recommendations:  [place] / Referral to community resources:  [place]  Patient/Family's Response to care:  Patient and family verbalize appreciation for CSW support and involvement in patient care.  Patient and family familiar with SNF placement and agreeable at discharge.  Patient/Family's Understanding of and Emotional Response to Diagnosis, Current Treatment, and Prognosis:  Patient and family understanding of patient barriers and limitations with immediate return home.  Patient and family appropriately concerned about patient well-being but hopeful for full recovery.  Emotional Assessment Appearance:  Appears stated age Attitude/Demeanor/Rapport:  Inconsistent (Engaged) Affect (typically observed):  Accepting, Appropriate, Overwhelmed, Hopeful Orientation:  Oriented to Self, Oriented to Place Alcohol / Substance use:  Not Applicable Psych involvement (Current and /or in the community):  No (Comment)  Discharge Needs  Concerns to be addressed:  Discharge Planning Concerns, Lack of Support Readmission within the last 30 days:  No Current discharge risk:  Lack of support system Barriers to Discharge:  Continued Medical Work up  [employer], [place]
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
## 2016-07-18 NOTE — Clinical Social Work Placement (Signed)
   CLINICAL SOCIAL WORK PLACEMENT  NOTE  Date:  07/18/2016  Patient Details  Name: Katherine Long MRN: [id number] Date of Birth: 1928-07-17  Clinical Social Work is seeking post-discharge placement for this patient at the [place] level of care (*CSW will initial, date and re-position this form in  chart as items are completed):  Yes   Patient/family provided with [place] Work Department's list of facilities offering this level of care within the geographic area requested by the patient (or if unable, by the patient's family).  Yes   Patient/family informed of their freedom to choose among providers that offer the needed level of care, that participate in Medicare, Medicaid or managed care program needed by the patient, have an available bed and are willing to accept the patient.  Yes   Patient/family informed of [facility]'s ownership interest in [facility] and [facility], as well as of the fact that they are under no obligation to receive care at these facilities.  PASRR submitted to EDS on 07/18/16     PASRR number received on 07/18/16     Existing PASRR number confirmed on       FL2 transmitted to all facilities in geographic area requested by pt/family on 07/18/16     FL2 transmitted to all facilities within larger geographic area on       Patient informed that his/her managed care company has contracts with or will negotiate with certain facilities, including the following:            Patient/family informed of bed offers received.  Patient chooses bed at       Physician recommends and patient chooses bed at      Patient to be transferred to   on  .  Patient to be transferred to facility by       Patient family notified on   of transfer.  Name of family member notified:        PHYSICIAN Please sign FL2     Additional Comment:    Barbette Or, [place]
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
## 2016-07-18 NOTE — Progress Notes (Signed)
Asked to review pacemaker interrogation - device followed by Dr Agustin Cree in [facility]. Normal device function, underlying accelerated junctional rhythm. When AP, significantly prolonged PR interval. Device reprogrammed today to DDD with fixed AV delays. She should follow up with primary cardiologist in next few weeks.  Electrophysiology team to see as needed while here. Please call with questions.  Chanetta Marshall, NP 07/18/2016 9:24 AM

## 2016-07-19 ENCOUNTER — Inpatient Hospital Stay (HOSPITAL_COMMUNITY): Payer: PPO

## 2016-07-19 LAB — URINALYSIS, ROUTINE W REFLEX MICROSCOPIC
BILIRUBIN URINE: NEGATIVE
GLUCOSE, UA: NEGATIVE mg/dL
Hgb urine dipstick: NEGATIVE
Ketones, ur: NEGATIVE mg/dL
Leukocytes, UA: NEGATIVE
NITRITE: NEGATIVE
PH: 7 (ref 5.0–8.0)
Protein, ur: NEGATIVE mg/dL
SPECIFIC GRAVITY, URINE: 1.014 (ref 1.005–1.030)

## 2016-07-19 LAB — CBC
HCT: 33.6 % — ABNORMAL LOW (ref 36.0–46.0)
Hemoglobin: 11 g/dL — ABNORMAL LOW (ref 12.0–15.0)
MCH: 30.1 pg (ref 26.0–34.0)
MCHC: 32.7 g/dL (ref 30.0–36.0)
MCV: 92.1 fL (ref 78.0–100.0)
PLATELETS: 98 10*3/uL — AB (ref 150–400)
RBC: 3.65 MIL/uL — AB (ref 3.87–5.11)
RDW: 13.2 % (ref 11.5–15.5)
WBC: 8.9 10*3/uL (ref 4.0–10.5)

## 2016-07-19 MED ORDER — FUROSEMIDE 20 MG PO TABS
20.0000 mg | ORAL_TABLET | Freq: Once | ORAL | Status: AC
Start: 2016-07-19 — End: 2016-07-19
  Administered 2016-07-19: 20 mg via ORAL
  Filled 2016-07-19: qty 1

## 2016-07-19 MED ORDER — MILK AND MOLASSES ENEMA
1.0000 | Freq: Once | RECTAL | Status: AC
  Administered 2016-07-19: 250 mL via RECTAL
  Filled 2016-07-19: qty 250

## 2016-07-19 NOTE — Progress Notes (Signed)
PT SEEN/EXAMINED NEW SPLINT APPLIED WOUNDS LOOK VERY GOOD, NO EVID OF INFECTION NEW DRESSINGS AND SPLINT APPLIED CONTINUE WITH ELEVATION DIGITAL MOTION NWB RUE DO NOT CHANGE SPLINT I WILL CONTINUE TO FOLLOW CALL ME DIRECTLY WITH QUESTIONS ABOUT ARM [phone]

## 2016-07-19 NOTE — Clinical Social Work Note (Signed)
Clinical Social Worker continuing to follow patient and family for support and discharge planning needs.  CSW spoke with patient daughter to provide bed offer from [facility] and confirmation of insurance authorization.  Patient daughter very appreciative and hopeful for patient recovery and discharge by the end of the week.  CSW remains available for support and to facilitate patient discharge needs once medically stable.  Barbette Or, [place]
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
## 2016-07-19 NOTE — Progress Notes (Signed)
Orthopedic Tech Progress Note Patient Details:  Katherine Long 03/03/1929 [id number]  Ortho Devices Type of Ortho Device: Ace wrap, Sugartong splint Ortho Device/Splint Location: rue Ortho Device/Splint Interventions: Application   Hildred Priest 07/19/2016, 12:18 PM As ordered by Dr. Apolonio Schneiders
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
## 2016-07-20 LAB — RESPIRATORY PANEL BY PCR
Adenovirus: NOT DETECTED
BORDETELLA PERTUSSIS-RVPCR: NOT DETECTED
Chlamydophila pneumoniae: NOT DETECTED
Coronavirus 229E: NOT DETECTED
Coronavirus HKU1: NOT DETECTED
Coronavirus NL63: NOT DETECTED
Coronavirus OC43: NOT DETECTED
INFLUENZA A-RVPPCR: NOT DETECTED
INFLUENZA B-RVPPCR: NOT DETECTED
Metapneumovirus: NOT DETECTED
Mycoplasma pneumoniae: NOT DETECTED
PARAINFLUENZA VIRUS 3-RVPPCR: NOT DETECTED
PARAINFLUENZA VIRUS 4-RVPPCR: NOT DETECTED
Parainfluenza Virus 1: NOT DETECTED
Parainfluenza Virus 2: NOT DETECTED
RESPIRATORY SYNCYTIAL VIRUS-RVPPCR: NOT DETECTED
RHINOVIRUS / ENTEROVIRUS - RVPPCR: NOT DETECTED

## 2016-07-20 LAB — EXPECTORATED SPUTUM ASSESSMENT W REFEX TO RESP CULTURE

## 2016-07-20 LAB — EXPECTORATED SPUTUM ASSESSMENT W GRAM STAIN, RFLX TO RESP C

## 2016-07-20 LAB — URINE CULTURE: Culture: <10000 — AB

## 2016-07-20 MED ORDER — DEXTROSE 5 % IV SOLN
1.0000 g | Freq: Three times a day (TID) | INTRAVENOUS | Status: DC
  Administered 2016-07-20 – 2016-07-21 (×3): 1 g via INTRAVENOUS
  Filled 2016-07-20 (×4): qty 1

## 2016-07-20 MED ORDER — ACETAMINOPHEN 500 MG PO TABS
500.0000 mg | ORAL_TABLET | Freq: Four times a day (QID) | ORAL | Status: DC | PRN
  Administered 2016-07-20: 500 mg via ORAL
  Filled 2016-07-20: qty 1

## 2016-07-20 MED ORDER — IPRATROPIUM-ALBUTEROL 0.5-2.5 (3) MG/3ML IN SOLN
3.0000 mL | Freq: Four times a day (QID) | RESPIRATORY_TRACT | Status: DC
  Administered 2016-07-20: 3 mL via RESPIRATORY_TRACT
  Filled 2016-07-20: qty 3

## 2016-07-20 MED ORDER — GUAIFENESIN 200 MG PO TABS
200.0000 mg | ORAL_TABLET | ORAL | Status: DC | PRN
  Administered 2016-07-20: 200 mg via ORAL
  Filled 2016-07-20 (×3): qty 1

## 2016-07-20 MED ORDER — DEXTROSE 5 % IV SOLN
2.0000 g | Freq: Once | INTRAVENOUS | Status: AC
  Administered 2016-07-20: 2 g via INTRAVENOUS
  Filled 2016-07-20: qty 2

## 2016-07-20 MED ORDER — IPRATROPIUM-ALBUTEROL 0.5-2.5 (3) MG/3ML IN SOLN
3.0000 mL | Freq: Three times a day (TID) | RESPIRATORY_TRACT | Status: DC
  Administered 2016-07-20 – 2016-07-21 (×4): 3 mL via RESPIRATORY_TRACT
  Filled 2016-07-20 (×4): qty 3
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
## 2016-07-20 NOTE — Progress Notes (Signed)
Notified PA about temp. Not moving down much after tylenol and they said to just monitor, it may take antibiotics a little bit of time to work.
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
## 2016-07-21 DIAGNOSIS — R509 Fever, unspecified: Secondary | ICD-10-CM | POA: Diagnosis not present

## 2016-07-21 DIAGNOSIS — Z4789 Encounter for other orthopedic aftercare: Secondary | ICD-10-CM | POA: Diagnosis not present

## 2016-07-21 DIAGNOSIS — S0240CD Maxillary fracture, right side, subsequent encounter for fracture with routine healing: Secondary | ICD-10-CM | POA: Diagnosis not present

## 2016-07-21 DIAGNOSIS — S02401A Maxillary fracture, unspecified, initial encounter for closed fracture: Secondary | ICD-10-CM | POA: Diagnosis not present

## 2016-07-21 DIAGNOSIS — J811 Chronic pulmonary edema: Secondary | ICD-10-CM | POA: Diagnosis not present

## 2016-07-21 DIAGNOSIS — S52699D Other fracture of lower end of unspecified ulna, subsequent encounter for closed fracture with routine healing: Secondary | ICD-10-CM | POA: Diagnosis not present

## 2016-07-21 DIAGNOSIS — M439 Deforming dorsopathy, unspecified: Secondary | ICD-10-CM | POA: Diagnosis not present

## 2016-07-21 DIAGNOSIS — S52571B Other intraarticular fracture of lower end of right radius, initial encounter for open fracture type I or II: Secondary | ICD-10-CM | POA: Diagnosis not present

## 2016-07-21 DIAGNOSIS — S62101A Fracture of unspecified carpal bone, right wrist, initial encounter for closed fracture: Secondary | ICD-10-CM | POA: Diagnosis not present

## 2016-07-21 DIAGNOSIS — S0231XD Fracture of orbital floor, right side, subsequent encounter for fracture with routine healing: Secondary | ICD-10-CM | POA: Diagnosis not present

## 2016-07-21 DIAGNOSIS — Z87820 Personal history of traumatic brain injury: Secondary | ICD-10-CM | POA: Diagnosis not present

## 2016-07-21 DIAGNOSIS — K219 Gastro-esophageal reflux disease without esophagitis: Secondary | ICD-10-CM | POA: Diagnosis not present

## 2016-07-21 DIAGNOSIS — G8911 Acute pain due to trauma: Secondary | ICD-10-CM | POA: Diagnosis not present

## 2016-07-21 DIAGNOSIS — S0231XA Fracture of orbital floor, right side, initial encounter for closed fracture: Secondary | ICD-10-CM | POA: Diagnosis not present

## 2016-07-21 DIAGNOSIS — R262 Difficulty in walking, not elsewhere classified: Secondary | ICD-10-CM | POA: Diagnosis not present

## 2016-07-21 DIAGNOSIS — S06899A Other specified intracranial injury with loss of consciousness of unspecified duration, initial encounter: Secondary | ICD-10-CM | POA: Diagnosis not present

## 2016-07-21 DIAGNOSIS — S52301D Unspecified fracture of shaft of right radius, subsequent encounter for closed fracture with routine healing: Secondary | ICD-10-CM | POA: Diagnosis not present

## 2016-07-21 DIAGNOSIS — S066X0A Traumatic subarachnoid hemorrhage without loss of consciousness, initial encounter: Secondary | ICD-10-CM | POA: Diagnosis not present

## 2016-07-21 DIAGNOSIS — I5032 Chronic diastolic (congestive) heart failure: Secondary | ICD-10-CM | POA: Diagnosis not present

## 2016-07-21 MED ORDER — PANTOPRAZOLE SODIUM 40 MG PO TBEC: 40.0000 mg | DELAYED_RELEASE_TABLET | Freq: Every day | ORAL | Status: AC

## 2016-07-21 MED ORDER — ACETAMINOPHEN 500 MG PO TABS: 500.0000 mg | ORAL_TABLET | Freq: Four times a day (QID) | ORAL | 0 refills | Status: DC | PRN

## 2016-07-21 MED ORDER — CYCLOBENZAPRINE HCL 10 MG PO TABS: 10.0000 mg | ORAL_TABLET | Freq: Three times a day (TID) | ORAL | 0 refills | Status: DC | PRN

## 2016-07-21 MED ORDER — IPRATROPIUM-ALBUTEROL 0.5-2.5 (3) MG/3ML IN SOLN: 3.0000 mL | Freq: Three times a day (TID) | RESPIRATORY_TRACT | Status: DC

## 2016-07-21 MED ORDER — DOXYCYCLINE HYCLATE 100 MG PO TABS: 100.0000 mg | ORAL_TABLET | Freq: Two times a day (BID) | ORAL | Status: DC

## 2016-07-21 MED ORDER — SENNOSIDES-DOCUSATE SODIUM 8.6-50 MG PO TABS: 1.0000 | ORAL_TABLET | Freq: Two times a day (BID) | ORAL | Status: AC

## 2016-07-21 MED ORDER — METOPROLOL SUCCINATE ER 25 MG PO TB24: 25.0000 mg | ORAL_TABLET | Freq: Every day | ORAL | Status: DC

## 2016-07-21 MED ORDER — DOXYCYCLINE HYCLATE 100 MG PO TABS: 100.0000 mg | ORAL_TABLET | Freq: Two times a day (BID) | ORAL | Status: AC

## 2016-07-21 MED ORDER — GUAIFENESIN 200 MG PO TABS: 200.0000 mg | ORAL_TABLET | ORAL | 0 refills | Status: DC | PRN

## 2016-07-21 MED ORDER — OXYCODONE-ACETAMINOPHEN 5-325 MG PO TABS: 1.0000 | ORAL_TABLET | Freq: Four times a day (QID) | ORAL | 0 refills | Status: DC | PRN

## 2016-07-21 NOTE — Progress Notes (Signed)
Discharge to [facility] , transported by [facility]. No complaints verbalized. Alert and oriented, not in any distress.
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
## 2016-07-21 NOTE — Progress Notes (Signed)
SATURATION QUALIFICATIONS: (This note is used to comply with regulatory documentation for home oxygen)  Patient Saturations on Room Air at Rest = 85%  Patient Saturations on Room Air while Ambulating = 79%  Patient Saturations on 3 Liters of oxygen while Ambulating = 88-93%  Please briefly explain why patient needs home oxygen:Pt desats on RA at rest and with activity. Will need continuous O2. Thanks.  [place] [phone] (pager)
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
## 2016-07-21 NOTE — Clinical Social Work Placement (Signed)
   CLINICAL SOCIAL WORK PLACEMENT  NOTE  Date:  07/21/2016  Patient Details  Name: Katherine Long MRN: [id number] Date of Birth: [birth year]  Clinical Social Work is seeking post-discharge placement for this patient at the [place] level of care (*CSW will initial, date and re-position this form in  chart as items are completed):  Yes   Patient/family provided with [facility] Work Department's list of facilities offering this level of care within the geographic area requested by the patient (or if unable, by the patient's family).  Yes   Patient/family informed of their freedom to choose among providers that offer the needed level of care, that participate in Medicare, Medicaid or managed care program needed by the patient, have an available bed and are willing to accept the patient.  Yes   Patient/family informed of [facility]'s ownership interest in [facility] and [facility], as well as of the fact that they are under no obligation to receive care at these facilities.  PASRR submitted to EDS on 07/18/16     PASRR number received on 07/18/16     Existing PASRR number confirmed on       FL2 transmitted to all facilities in geographic area requested by pt/family on 07/18/16     FL2 transmitted to all facilities within larger geographic area on       Patient informed that his/her managed care company has contracts with or will negotiate with certain facilities, including the following:        Yes   Patient/family informed of bed offers received.  Patient chooses bed at [facility]     Physician recommends and patient chooses bed at      Patient to be transferred to [facility] on 07/21/16.  Patient to be transferred to facility by Ambulance     Patient family notified on 07/21/16 of transfer.  Name of family member notified:  Patient daughter at bedside     PHYSICIAN Please sign FL2     Additional Comment:   Barbette Or, [place]

## 2016-07-21 NOTE — Care Management Important Message (Signed)
Important Message  Patient Details  Name: Katherine Long MRN: [id number] Date of Birth: Sep 27, 1928   Medicare Important Message Given:  Yes    [name] 07/21/2016, 4:04 PM

## 2016-07-21 NOTE — Progress Notes (Signed)
Report given to Larina Earthly, RN at [employer]
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
## 2016-07-21 NOTE — Clinical Social Work Note (Signed)
Clinical Social Worker facilitated patient discharge including contacting patient family and facility to confirm patient discharge plans.  Clinical information faxed to facility and family agreeable with plan.  CSW arranged ambulance transport via [facility] to [facility].  RN to call report prior to discharge.  [employer] authorization - [id number].  Clinical Social Worker will sign off for now as social work intervention is no longer needed. Please consult [place] again if new need arises.  Barbette Or, [place]

## 2016-07-22 LAB — CULTURE, RESPIRATORY

## 2016-07-22 LAB — CULTURE, RESPIRATORY W GRAM STAIN

## 2016-07-25 LAB — CULTURE, BLOOD (ROUTINE X 2)
CULTURE: NO GROWTH
Culture: NO GROWTH

## 2016-07-28 DIAGNOSIS — Z4789 Encounter for other orthopedic aftercare: Secondary | ICD-10-CM | POA: Diagnosis not present

## 2016-07-28 DIAGNOSIS — S52571B Other intraarticular fracture of lower end of right radius, initial encounter for open fracture type I or II: Secondary | ICD-10-CM | POA: Diagnosis not present

## 2016-07-29 DIAGNOSIS — M439 Deforming dorsopathy, unspecified: Secondary | ICD-10-CM | POA: Diagnosis not present

## 2016-07-29 DIAGNOSIS — K219 Gastro-esophageal reflux disease without esophagitis: Secondary | ICD-10-CM | POA: Diagnosis not present

## 2016-07-29 DIAGNOSIS — I5032 Chronic diastolic (congestive) heart failure: Secondary | ICD-10-CM | POA: Diagnosis not present

## 2016-07-29 DIAGNOSIS — R262 Difficulty in walking, not elsewhere classified: Secondary | ICD-10-CM | POA: Diagnosis not present

## 2016-08-07 DIAGNOSIS — Z45018 Encounter for adjustment and management of other part of cardiac pacemaker: Secondary | ICD-10-CM | POA: Diagnosis not present

## 2016-08-07 DIAGNOSIS — I5042 Chronic combined systolic (congestive) and diastolic (congestive) heart failure: Secondary | ICD-10-CM | POA: Diagnosis not present

## 2016-08-07 DIAGNOSIS — I471 Supraventricular tachycardia: Secondary | ICD-10-CM | POA: Diagnosis not present

## 2016-08-09 DIAGNOSIS — J189 Pneumonia, unspecified organism: Secondary | ICD-10-CM | POA: Diagnosis not present

## 2016-08-11 DIAGNOSIS — S52301D Unspecified fracture of shaft of right radius, subsequent encounter for closed fracture with routine healing: Secondary | ICD-10-CM | POA: Diagnosis not present

## 2016-08-11 DIAGNOSIS — Z7982 Long term (current) use of aspirin: Secondary | ICD-10-CM | POA: Diagnosis not present

## 2016-08-11 DIAGNOSIS — M1991 Primary osteoarthritis, unspecified site: Secondary | ICD-10-CM | POA: Diagnosis not present

## 2016-08-11 DIAGNOSIS — Z87820 Personal history of traumatic brain injury: Secondary | ICD-10-CM | POA: Diagnosis not present

## 2016-08-11 DIAGNOSIS — R489 Unspecified symbolic dysfunctions: Secondary | ICD-10-CM | POA: Diagnosis not present

## 2016-08-11 DIAGNOSIS — F419 Anxiety disorder, unspecified: Secondary | ICD-10-CM | POA: Diagnosis not present

## 2016-08-11 DIAGNOSIS — Z6832 Body mass index (BMI) 32.0-32.9, adult: Secondary | ICD-10-CM | POA: Diagnosis not present

## 2016-08-11 DIAGNOSIS — M6281 Muscle weakness (generalized): Secondary | ICD-10-CM | POA: Diagnosis not present

## 2016-08-11 DIAGNOSIS — G4733 Obstructive sleep apnea (adult) (pediatric): Secondary | ICD-10-CM | POA: Diagnosis not present

## 2016-08-11 DIAGNOSIS — S52699D Other fracture of lower end of unspecified ulna, subsequent encounter for closed fracture with routine healing: Secondary | ICD-10-CM | POA: Diagnosis not present

## 2016-08-11 DIAGNOSIS — I509 Heart failure, unspecified: Secondary | ICD-10-CM | POA: Diagnosis not present

## 2016-08-11 DIAGNOSIS — S0231XD Fracture of orbital floor, right side, subsequent encounter for fracture with routine healing: Secondary | ICD-10-CM | POA: Diagnosis not present

## 2016-08-11 DIAGNOSIS — F329 Major depressive disorder, single episode, unspecified: Secondary | ICD-10-CM | POA: Diagnosis not present

## 2016-08-11 DIAGNOSIS — Z791 Long term (current) use of non-steroidal anti-inflammatories (NSAID): Secondary | ICD-10-CM | POA: Diagnosis not present

## 2016-08-11 DIAGNOSIS — Z950 Presence of cardiac pacemaker: Secondary | ICD-10-CM | POA: Diagnosis not present

## 2016-08-11 DIAGNOSIS — I4891 Unspecified atrial fibrillation: Secondary | ICD-10-CM | POA: Diagnosis not present

## 2016-08-14 DIAGNOSIS — S0280XD Fracture of other specified skull and facial bones, unspecified side, subsequent encounter for fracture with routine healing: Secondary | ICD-10-CM | POA: Diagnosis not present

## 2016-08-24 DIAGNOSIS — R05 Cough: Secondary | ICD-10-CM | POA: Diagnosis not present

## 2016-08-24 DIAGNOSIS — Z8679 Personal history of other diseases of the circulatory system: Secondary | ICD-10-CM | POA: Diagnosis not present

## 2016-08-24 DIAGNOSIS — R062 Wheezing: Secondary | ICD-10-CM | POA: Diagnosis not present

## 2016-08-28 DIAGNOSIS — T148XXA Other injury of unspecified body region, initial encounter: Secondary | ICD-10-CM | POA: Diagnosis not present

## 2016-08-29 DIAGNOSIS — Z4789 Encounter for other orthopedic aftercare: Secondary | ICD-10-CM | POA: Diagnosis not present

## 2016-08-29 DIAGNOSIS — S52571B Other intraarticular fracture of lower end of right radius, initial encounter for open fracture type I or II: Secondary | ICD-10-CM | POA: Diagnosis not present

## 2016-09-08 DIAGNOSIS — J0100 Acute maxillary sinusitis, unspecified: Secondary | ICD-10-CM | POA: Diagnosis not present

## 2016-09-08 DIAGNOSIS — J029 Acute pharyngitis, unspecified: Secondary | ICD-10-CM | POA: Diagnosis not present

## 2016-09-19 ENCOUNTER — Other Ambulatory Visit: Payer: Self-pay | Admitting: Neurological Surgery

## 2016-09-19 DIAGNOSIS — S066X9D Traumatic subarachnoid hemorrhage with loss of consciousness of unspecified duration, subsequent encounter: Secondary | ICD-10-CM | POA: Diagnosis not present

## 2016-09-22 DIAGNOSIS — Z4789 Encounter for other orthopedic aftercare: Secondary | ICD-10-CM | POA: Diagnosis not present

## 2016-09-22 DIAGNOSIS — S52571B Other intraarticular fracture of lower end of right radius, initial encounter for open fracture type I or II: Secondary | ICD-10-CM | POA: Diagnosis not present

## 2016-09-25 ENCOUNTER — Ambulatory Visit
Admission: RE | Admit: 2016-09-25 | Discharge: 2016-09-25 | Disposition: A | Discharge status: Home / Self Care | Payer: PPO | Source: Ambulatory Visit | Attending: Neurological Surgery | Admitting: Neurological Surgery

## 2016-09-25 DIAGNOSIS — S066X9D Traumatic subarachnoid hemorrhage with loss of consciousness of unspecified duration, subsequent encounter: Secondary | ICD-10-CM

## 2016-09-25 DIAGNOSIS — I629 Nontraumatic intracranial hemorrhage, unspecified: Secondary | ICD-10-CM | POA: Diagnosis not present

## 2016-09-28 DIAGNOSIS — S066X9D Traumatic subarachnoid hemorrhage with loss of consciousness of unspecified duration, subsequent encounter: Secondary | ICD-10-CM | POA: Diagnosis not present

## 2016-10-20 DIAGNOSIS — S52571D Other intraarticular fracture of lower end of right radius, subsequent encounter for closed fracture with routine healing: Secondary | ICD-10-CM | POA: Diagnosis not present

## 2016-10-20 DIAGNOSIS — S52691D Other fracture of lower end of right ulna, subsequent encounter for closed fracture with routine healing: Secondary | ICD-10-CM | POA: Diagnosis not present

## 2016-11-07 DIAGNOSIS — Z45018 Encounter for adjustment and management of other part of cardiac pacemaker: Secondary | ICD-10-CM | POA: Diagnosis not present

## 2016-11-07 DIAGNOSIS — I480 Paroxysmal atrial fibrillation: Secondary | ICD-10-CM | POA: Diagnosis not present

## 2016-11-07 DIAGNOSIS — R0602 Shortness of breath: Secondary | ICD-10-CM | POA: Diagnosis not present

## 2016-11-07 DIAGNOSIS — I471 Supraventricular tachycardia: Secondary | ICD-10-CM | POA: Diagnosis not present

## 2016-12-01 DIAGNOSIS — S52691D Other fracture of lower end of right ulna, subsequent encounter for closed fracture with routine healing: Secondary | ICD-10-CM | POA: Diagnosis not present

## 2016-12-07 DIAGNOSIS — M5032 Other cervical disc degeneration, mid-cervical region, unspecified level: Secondary | ICD-10-CM | POA: Diagnosis not present

## 2016-12-07 DIAGNOSIS — M9901 Segmental and somatic dysfunction of cervical region: Secondary | ICD-10-CM | POA: Diagnosis not present

## 2016-12-07 DIAGNOSIS — M9902 Segmental and somatic dysfunction of thoracic region: Secondary | ICD-10-CM | POA: Diagnosis not present

## 2016-12-07 DIAGNOSIS — M5136 Other intervertebral disc degeneration, lumbar region: Secondary | ICD-10-CM | POA: Diagnosis not present

## 2016-12-07 DIAGNOSIS — S39012A Strain of muscle, fascia and tendon of lower back, initial encounter: Secondary | ICD-10-CM | POA: Diagnosis not present

## 2016-12-07 DIAGNOSIS — M9903 Segmental and somatic dysfunction of lumbar region: Secondary | ICD-10-CM | POA: Diagnosis not present

## 2016-12-07 DIAGNOSIS — S161XXA Strain of muscle, fascia and tendon at neck level, initial encounter: Secondary | ICD-10-CM | POA: Diagnosis not present

## 2016-12-07 DIAGNOSIS — M542 Cervicalgia: Secondary | ICD-10-CM | POA: Diagnosis not present

## 2016-12-07 DIAGNOSIS — S29019A Strain of muscle and tendon of unspecified wall of thorax, initial encounter: Secondary | ICD-10-CM | POA: Diagnosis not present

## 2016-12-13 DIAGNOSIS — S39012A Strain of muscle, fascia and tendon of lower back, initial encounter: Secondary | ICD-10-CM | POA: Diagnosis not present

## 2016-12-13 DIAGNOSIS — S29019A Strain of muscle and tendon of unspecified wall of thorax, initial encounter: Secondary | ICD-10-CM | POA: Diagnosis not present

## 2016-12-13 DIAGNOSIS — S161XXA Strain of muscle, fascia and tendon at neck level, initial encounter: Secondary | ICD-10-CM | POA: Diagnosis not present

## 2016-12-13 DIAGNOSIS — M542 Cervicalgia: Secondary | ICD-10-CM | POA: Diagnosis not present

## 2016-12-13 DIAGNOSIS — M9901 Segmental and somatic dysfunction of cervical region: Secondary | ICD-10-CM | POA: Diagnosis not present

## 2016-12-13 DIAGNOSIS — M9902 Segmental and somatic dysfunction of thoracic region: Secondary | ICD-10-CM | POA: Diagnosis not present

## 2016-12-13 DIAGNOSIS — M9903 Segmental and somatic dysfunction of lumbar region: Secondary | ICD-10-CM | POA: Diagnosis not present

## 2016-12-13 DIAGNOSIS — M5136 Other intervertebral disc degeneration, lumbar region: Secondary | ICD-10-CM | POA: Diagnosis not present

## 2016-12-13 DIAGNOSIS — M5032 Other cervical disc degeneration, mid-cervical region, unspecified level: Secondary | ICD-10-CM | POA: Diagnosis not present

## 2016-12-18 DIAGNOSIS — M542 Cervicalgia: Secondary | ICD-10-CM | POA: Diagnosis not present

## 2016-12-18 DIAGNOSIS — S161XXA Strain of muscle, fascia and tendon at neck level, initial encounter: Secondary | ICD-10-CM | POA: Diagnosis not present

## 2016-12-18 DIAGNOSIS — M5032 Other cervical disc degeneration, mid-cervical region, unspecified level: Secondary | ICD-10-CM | POA: Diagnosis not present

## 2016-12-18 DIAGNOSIS — S29019A Strain of muscle and tendon of unspecified wall of thorax, initial encounter: Secondary | ICD-10-CM | POA: Diagnosis not present

## 2016-12-18 DIAGNOSIS — M9901 Segmental and somatic dysfunction of cervical region: Secondary | ICD-10-CM | POA: Diagnosis not present

## 2016-12-18 DIAGNOSIS — M5136 Other intervertebral disc degeneration, lumbar region: Secondary | ICD-10-CM | POA: Diagnosis not present

## 2016-12-18 DIAGNOSIS — M9903 Segmental and somatic dysfunction of lumbar region: Secondary | ICD-10-CM | POA: Diagnosis not present

## 2016-12-18 DIAGNOSIS — S39012A Strain of muscle, fascia and tendon of lower back, initial encounter: Secondary | ICD-10-CM | POA: Diagnosis not present

## 2016-12-18 DIAGNOSIS — M9902 Segmental and somatic dysfunction of thoracic region: Secondary | ICD-10-CM | POA: Diagnosis not present

## 2016-12-20 DIAGNOSIS — M9901 Segmental and somatic dysfunction of cervical region: Secondary | ICD-10-CM | POA: Diagnosis not present

## 2016-12-20 DIAGNOSIS — S161XXA Strain of muscle, fascia and tendon at neck level, initial encounter: Secondary | ICD-10-CM | POA: Diagnosis not present

## 2016-12-20 DIAGNOSIS — M542 Cervicalgia: Secondary | ICD-10-CM | POA: Diagnosis not present

## 2016-12-20 DIAGNOSIS — M9902 Segmental and somatic dysfunction of thoracic region: Secondary | ICD-10-CM | POA: Diagnosis not present

## 2016-12-20 DIAGNOSIS — M5032 Other cervical disc degeneration, mid-cervical region, unspecified level: Secondary | ICD-10-CM | POA: Diagnosis not present

## 2016-12-20 DIAGNOSIS — M5136 Other intervertebral disc degeneration, lumbar region: Secondary | ICD-10-CM | POA: Diagnosis not present

## 2016-12-20 DIAGNOSIS — M9903 Segmental and somatic dysfunction of lumbar region: Secondary | ICD-10-CM | POA: Diagnosis not present

## 2016-12-20 DIAGNOSIS — S39012A Strain of muscle, fascia and tendon of lower back, initial encounter: Secondary | ICD-10-CM | POA: Diagnosis not present

## 2016-12-20 DIAGNOSIS — S29019A Strain of muscle and tendon of unspecified wall of thorax, initial encounter: Secondary | ICD-10-CM | POA: Diagnosis not present

## 2016-12-22 DIAGNOSIS — S39012A Strain of muscle, fascia and tendon of lower back, initial encounter: Secondary | ICD-10-CM | POA: Diagnosis not present

## 2016-12-22 DIAGNOSIS — M5032 Other cervical disc degeneration, mid-cervical region, unspecified level: Secondary | ICD-10-CM | POA: Diagnosis not present

## 2016-12-22 DIAGNOSIS — M9902 Segmental and somatic dysfunction of thoracic region: Secondary | ICD-10-CM | POA: Diagnosis not present

## 2016-12-22 DIAGNOSIS — M542 Cervicalgia: Secondary | ICD-10-CM | POA: Diagnosis not present

## 2016-12-22 DIAGNOSIS — M9903 Segmental and somatic dysfunction of lumbar region: Secondary | ICD-10-CM | POA: Diagnosis not present

## 2016-12-22 DIAGNOSIS — S29019A Strain of muscle and tendon of unspecified wall of thorax, initial encounter: Secondary | ICD-10-CM | POA: Diagnosis not present

## 2016-12-22 DIAGNOSIS — M5136 Other intervertebral disc degeneration, lumbar region: Secondary | ICD-10-CM | POA: Diagnosis not present

## 2016-12-22 DIAGNOSIS — M9901 Segmental and somatic dysfunction of cervical region: Secondary | ICD-10-CM | POA: Diagnosis not present

## 2016-12-22 DIAGNOSIS — S161XXA Strain of muscle, fascia and tendon at neck level, initial encounter: Secondary | ICD-10-CM | POA: Diagnosis not present

## 2016-12-25 DIAGNOSIS — S39012A Strain of muscle, fascia and tendon of lower back, initial encounter: Secondary | ICD-10-CM | POA: Diagnosis not present

## 2016-12-25 DIAGNOSIS — I480 Paroxysmal atrial fibrillation: Secondary | ICD-10-CM | POA: Diagnosis not present

## 2016-12-25 DIAGNOSIS — Z45018 Encounter for adjustment and management of other part of cardiac pacemaker: Secondary | ICD-10-CM | POA: Diagnosis not present

## 2016-12-25 DIAGNOSIS — I471 Supraventricular tachycardia: Secondary | ICD-10-CM | POA: Diagnosis not present

## 2016-12-25 DIAGNOSIS — M9902 Segmental and somatic dysfunction of thoracic region: Secondary | ICD-10-CM | POA: Diagnosis not present

## 2016-12-25 DIAGNOSIS — S161XXA Strain of muscle, fascia and tendon at neck level, initial encounter: Secondary | ICD-10-CM | POA: Diagnosis not present

## 2016-12-25 DIAGNOSIS — R0602 Shortness of breath: Secondary | ICD-10-CM | POA: Diagnosis not present

## 2016-12-25 DIAGNOSIS — M9903 Segmental and somatic dysfunction of lumbar region: Secondary | ICD-10-CM | POA: Diagnosis not present

## 2016-12-25 DIAGNOSIS — M5136 Other intervertebral disc degeneration, lumbar region: Secondary | ICD-10-CM | POA: Diagnosis not present

## 2016-12-25 DIAGNOSIS — M5032 Other cervical disc degeneration, mid-cervical region, unspecified level: Secondary | ICD-10-CM | POA: Diagnosis not present

## 2016-12-25 DIAGNOSIS — M9901 Segmental and somatic dysfunction of cervical region: Secondary | ICD-10-CM | POA: Diagnosis not present

## 2016-12-25 DIAGNOSIS — S29019A Strain of muscle and tendon of unspecified wall of thorax, initial encounter: Secondary | ICD-10-CM | POA: Diagnosis not present

## 2016-12-25 DIAGNOSIS — J9801 Acute bronchospasm: Secondary | ICD-10-CM | POA: Diagnosis not present

## 2016-12-25 DIAGNOSIS — M542 Cervicalgia: Secondary | ICD-10-CM | POA: Diagnosis not present

## 2016-12-27 DIAGNOSIS — M9902 Segmental and somatic dysfunction of thoracic region: Secondary | ICD-10-CM | POA: Diagnosis not present

## 2016-12-27 DIAGNOSIS — S39012A Strain of muscle, fascia and tendon of lower back, initial encounter: Secondary | ICD-10-CM | POA: Diagnosis not present

## 2016-12-27 DIAGNOSIS — M9903 Segmental and somatic dysfunction of lumbar region: Secondary | ICD-10-CM | POA: Diagnosis not present

## 2016-12-27 DIAGNOSIS — S29019A Strain of muscle and tendon of unspecified wall of thorax, initial encounter: Secondary | ICD-10-CM | POA: Diagnosis not present

## 2016-12-27 DIAGNOSIS — M9901 Segmental and somatic dysfunction of cervical region: Secondary | ICD-10-CM | POA: Diagnosis not present

## 2016-12-27 DIAGNOSIS — M5136 Other intervertebral disc degeneration, lumbar region: Secondary | ICD-10-CM | POA: Diagnosis not present

## 2016-12-27 DIAGNOSIS — M542 Cervicalgia: Secondary | ICD-10-CM | POA: Diagnosis not present

## 2016-12-27 DIAGNOSIS — M5032 Other cervical disc degeneration, mid-cervical region, unspecified level: Secondary | ICD-10-CM | POA: Diagnosis not present

## 2016-12-27 DIAGNOSIS — S161XXA Strain of muscle, fascia and tendon at neck level, initial encounter: Secondary | ICD-10-CM | POA: Diagnosis not present

## 2016-12-29 DIAGNOSIS — M5032 Other cervical disc degeneration, mid-cervical region, unspecified level: Secondary | ICD-10-CM | POA: Diagnosis not present

## 2016-12-29 DIAGNOSIS — S161XXA Strain of muscle, fascia and tendon at neck level, initial encounter: Secondary | ICD-10-CM | POA: Diagnosis not present

## 2016-12-29 DIAGNOSIS — M5136 Other intervertebral disc degeneration, lumbar region: Secondary | ICD-10-CM | POA: Diagnosis not present

## 2016-12-29 DIAGNOSIS — S39012A Strain of muscle, fascia and tendon of lower back, initial encounter: Secondary | ICD-10-CM | POA: Diagnosis not present

## 2016-12-29 DIAGNOSIS — M9901 Segmental and somatic dysfunction of cervical region: Secondary | ICD-10-CM | POA: Diagnosis not present

## 2016-12-29 DIAGNOSIS — M542 Cervicalgia: Secondary | ICD-10-CM | POA: Diagnosis not present

## 2016-12-29 DIAGNOSIS — M9903 Segmental and somatic dysfunction of lumbar region: Secondary | ICD-10-CM | POA: Diagnosis not present

## 2016-12-29 DIAGNOSIS — M9902 Segmental and somatic dysfunction of thoracic region: Secondary | ICD-10-CM | POA: Diagnosis not present

## 2016-12-29 DIAGNOSIS — S29019A Strain of muscle and tendon of unspecified wall of thorax, initial encounter: Secondary | ICD-10-CM | POA: Diagnosis not present

## 2017-01-01 DIAGNOSIS — M9903 Segmental and somatic dysfunction of lumbar region: Secondary | ICD-10-CM | POA: Diagnosis not present

## 2017-01-01 DIAGNOSIS — M9902 Segmental and somatic dysfunction of thoracic region: Secondary | ICD-10-CM | POA: Diagnosis not present

## 2017-01-01 DIAGNOSIS — S161XXA Strain of muscle, fascia and tendon at neck level, initial encounter: Secondary | ICD-10-CM | POA: Diagnosis not present

## 2017-01-01 DIAGNOSIS — M5032 Other cervical disc degeneration, mid-cervical region, unspecified level: Secondary | ICD-10-CM | POA: Diagnosis not present

## 2017-01-01 DIAGNOSIS — S39012A Strain of muscle, fascia and tendon of lower back, initial encounter: Secondary | ICD-10-CM | POA: Diagnosis not present

## 2017-01-01 DIAGNOSIS — M542 Cervicalgia: Secondary | ICD-10-CM | POA: Diagnosis not present

## 2017-01-01 DIAGNOSIS — S29019A Strain of muscle and tendon of unspecified wall of thorax, initial encounter: Secondary | ICD-10-CM | POA: Diagnosis not present

## 2017-01-01 DIAGNOSIS — M9901 Segmental and somatic dysfunction of cervical region: Secondary | ICD-10-CM | POA: Diagnosis not present

## 2017-01-01 DIAGNOSIS — M5136 Other intervertebral disc degeneration, lumbar region: Secondary | ICD-10-CM | POA: Diagnosis not present

## 2017-01-03 DIAGNOSIS — M542 Cervicalgia: Secondary | ICD-10-CM | POA: Diagnosis not present

## 2017-01-03 DIAGNOSIS — M5136 Other intervertebral disc degeneration, lumbar region: Secondary | ICD-10-CM | POA: Diagnosis not present

## 2017-01-03 DIAGNOSIS — S39012A Strain of muscle, fascia and tendon of lower back, initial encounter: Secondary | ICD-10-CM | POA: Diagnosis not present

## 2017-01-03 DIAGNOSIS — M9901 Segmental and somatic dysfunction of cervical region: Secondary | ICD-10-CM | POA: Diagnosis not present

## 2017-01-03 DIAGNOSIS — M9902 Segmental and somatic dysfunction of thoracic region: Secondary | ICD-10-CM | POA: Diagnosis not present

## 2017-01-03 DIAGNOSIS — S29019A Strain of muscle and tendon of unspecified wall of thorax, initial encounter: Secondary | ICD-10-CM | POA: Diagnosis not present

## 2017-01-03 DIAGNOSIS — M5032 Other cervical disc degeneration, mid-cervical region, unspecified level: Secondary | ICD-10-CM | POA: Diagnosis not present

## 2017-01-03 DIAGNOSIS — M9903 Segmental and somatic dysfunction of lumbar region: Secondary | ICD-10-CM | POA: Diagnosis not present

## 2017-01-03 DIAGNOSIS — S161XXA Strain of muscle, fascia and tendon at neck level, initial encounter: Secondary | ICD-10-CM | POA: Diagnosis not present

## 2017-01-05 DIAGNOSIS — M9902 Segmental and somatic dysfunction of thoracic region: Secondary | ICD-10-CM | POA: Diagnosis not present

## 2017-01-05 DIAGNOSIS — S39012A Strain of muscle, fascia and tendon of lower back, initial encounter: Secondary | ICD-10-CM | POA: Diagnosis not present

## 2017-01-05 DIAGNOSIS — M9903 Segmental and somatic dysfunction of lumbar region: Secondary | ICD-10-CM | POA: Diagnosis not present

## 2017-01-05 DIAGNOSIS — S161XXA Strain of muscle, fascia and tendon at neck level, initial encounter: Secondary | ICD-10-CM | POA: Diagnosis not present

## 2017-01-05 DIAGNOSIS — M9901 Segmental and somatic dysfunction of cervical region: Secondary | ICD-10-CM | POA: Diagnosis not present

## 2017-01-05 DIAGNOSIS — M5032 Other cervical disc degeneration, mid-cervical region, unspecified level: Secondary | ICD-10-CM | POA: Diagnosis not present

## 2017-01-05 DIAGNOSIS — S29019A Strain of muscle and tendon of unspecified wall of thorax, initial encounter: Secondary | ICD-10-CM | POA: Diagnosis not present

## 2017-01-05 DIAGNOSIS — M542 Cervicalgia: Secondary | ICD-10-CM | POA: Diagnosis not present

## 2017-01-05 DIAGNOSIS — M5136 Other intervertebral disc degeneration, lumbar region: Secondary | ICD-10-CM | POA: Diagnosis not present

## 2017-01-08 DIAGNOSIS — S29019A Strain of muscle and tendon of unspecified wall of thorax, initial encounter: Secondary | ICD-10-CM | POA: Diagnosis not present

## 2017-01-08 DIAGNOSIS — M542 Cervicalgia: Secondary | ICD-10-CM | POA: Diagnosis not present

## 2017-01-08 DIAGNOSIS — S39012A Strain of muscle, fascia and tendon of lower back, initial encounter: Secondary | ICD-10-CM | POA: Diagnosis not present

## 2017-01-08 DIAGNOSIS — S161XXA Strain of muscle, fascia and tendon at neck level, initial encounter: Secondary | ICD-10-CM | POA: Diagnosis not present

## 2017-01-08 DIAGNOSIS — M5136 Other intervertebral disc degeneration, lumbar region: Secondary | ICD-10-CM | POA: Diagnosis not present

## 2017-01-08 DIAGNOSIS — M9901 Segmental and somatic dysfunction of cervical region: Secondary | ICD-10-CM | POA: Diagnosis not present

## 2017-01-08 DIAGNOSIS — I471 Supraventricular tachycardia: Secondary | ICD-10-CM | POA: Diagnosis not present

## 2017-01-08 DIAGNOSIS — M9902 Segmental and somatic dysfunction of thoracic region: Secondary | ICD-10-CM | POA: Diagnosis not present

## 2017-01-08 DIAGNOSIS — M5032 Other cervical disc degeneration, mid-cervical region, unspecified level: Secondary | ICD-10-CM | POA: Diagnosis not present

## 2017-01-08 DIAGNOSIS — M9903 Segmental and somatic dysfunction of lumbar region: Secondary | ICD-10-CM | POA: Diagnosis not present

## 2017-01-10 DIAGNOSIS — F419 Anxiety disorder, unspecified: Secondary | ICD-10-CM | POA: Diagnosis not present

## 2017-01-10 DIAGNOSIS — S161XXA Strain of muscle, fascia and tendon at neck level, initial encounter: Secondary | ICD-10-CM | POA: Diagnosis not present

## 2017-01-10 DIAGNOSIS — M9903 Segmental and somatic dysfunction of lumbar region: Secondary | ICD-10-CM | POA: Diagnosis not present

## 2017-01-10 DIAGNOSIS — S39012A Strain of muscle, fascia and tendon of lower back, initial encounter: Secondary | ICD-10-CM | POA: Diagnosis not present

## 2017-01-10 DIAGNOSIS — M5136 Other intervertebral disc degeneration, lumbar region: Secondary | ICD-10-CM | POA: Diagnosis not present

## 2017-01-10 DIAGNOSIS — M542 Cervicalgia: Secondary | ICD-10-CM | POA: Diagnosis not present

## 2017-01-10 DIAGNOSIS — M5032 Other cervical disc degeneration, mid-cervical region, unspecified level: Secondary | ICD-10-CM | POA: Diagnosis not present

## 2017-01-10 DIAGNOSIS — I2510 Atherosclerotic heart disease of native coronary artery without angina pectoris: Secondary | ICD-10-CM | POA: Diagnosis not present

## 2017-01-10 DIAGNOSIS — S29019A Strain of muscle and tendon of unspecified wall of thorax, initial encounter: Secondary | ICD-10-CM | POA: Diagnosis not present

## 2017-01-10 DIAGNOSIS — M797 Fibromyalgia: Secondary | ICD-10-CM | POA: Diagnosis not present

## 2017-01-10 DIAGNOSIS — M9902 Segmental and somatic dysfunction of thoracic region: Secondary | ICD-10-CM | POA: Diagnosis not present

## 2017-01-10 DIAGNOSIS — M9901 Segmental and somatic dysfunction of cervical region: Secondary | ICD-10-CM | POA: Diagnosis not present

## 2017-01-15 DIAGNOSIS — M542 Cervicalgia: Secondary | ICD-10-CM | POA: Diagnosis not present

## 2017-01-15 DIAGNOSIS — S161XXA Strain of muscle, fascia and tendon at neck level, initial encounter: Secondary | ICD-10-CM | POA: Diagnosis not present

## 2017-01-15 DIAGNOSIS — M5032 Other cervical disc degeneration, mid-cervical region, unspecified level: Secondary | ICD-10-CM | POA: Diagnosis not present

## 2017-01-15 DIAGNOSIS — S39012A Strain of muscle, fascia and tendon of lower back, initial encounter: Secondary | ICD-10-CM | POA: Diagnosis not present

## 2017-01-15 DIAGNOSIS — S29019A Strain of muscle and tendon of unspecified wall of thorax, initial encounter: Secondary | ICD-10-CM | POA: Diagnosis not present

## 2017-01-15 DIAGNOSIS — M9903 Segmental and somatic dysfunction of lumbar region: Secondary | ICD-10-CM | POA: Diagnosis not present

## 2017-01-15 DIAGNOSIS — M5136 Other intervertebral disc degeneration, lumbar region: Secondary | ICD-10-CM | POA: Diagnosis not present

## 2017-01-15 DIAGNOSIS — M9901 Segmental and somatic dysfunction of cervical region: Secondary | ICD-10-CM | POA: Diagnosis not present

## 2017-01-15 DIAGNOSIS — M9902 Segmental and somatic dysfunction of thoracic region: Secondary | ICD-10-CM | POA: Diagnosis not present

## 2017-02-01 DIAGNOSIS — M9903 Segmental and somatic dysfunction of lumbar region: Secondary | ICD-10-CM | POA: Diagnosis not present

## 2017-02-01 DIAGNOSIS — M5137 Other intervertebral disc degeneration, lumbosacral region: Secondary | ICD-10-CM | POA: Diagnosis not present

## 2017-02-01 DIAGNOSIS — M5442 Lumbago with sciatica, left side: Secondary | ICD-10-CM | POA: Diagnosis not present

## 2017-02-01 DIAGNOSIS — M5136 Other intervertebral disc degeneration, lumbar region: Secondary | ICD-10-CM | POA: Diagnosis not present

## 2017-02-01 DIAGNOSIS — M9902 Segmental and somatic dysfunction of thoracic region: Secondary | ICD-10-CM | POA: Diagnosis not present

## 2017-02-01 DIAGNOSIS — M9904 Segmental and somatic dysfunction of sacral region: Secondary | ICD-10-CM | POA: Diagnosis not present

## 2017-02-05 DIAGNOSIS — M9903 Segmental and somatic dysfunction of lumbar region: Secondary | ICD-10-CM | POA: Diagnosis not present

## 2017-02-05 DIAGNOSIS — M9902 Segmental and somatic dysfunction of thoracic region: Secondary | ICD-10-CM | POA: Diagnosis not present

## 2017-02-05 DIAGNOSIS — M5137 Other intervertebral disc degeneration, lumbosacral region: Secondary | ICD-10-CM | POA: Diagnosis not present

## 2017-02-05 DIAGNOSIS — M5136 Other intervertebral disc degeneration, lumbar region: Secondary | ICD-10-CM | POA: Diagnosis not present

## 2017-02-05 DIAGNOSIS — M5442 Lumbago with sciatica, left side: Secondary | ICD-10-CM | POA: Diagnosis not present

## 2017-02-05 DIAGNOSIS — M9904 Segmental and somatic dysfunction of sacral region: Secondary | ICD-10-CM | POA: Diagnosis not present

## 2017-02-06 DIAGNOSIS — I452 Bifascicular block: Secondary | ICD-10-CM | POA: Diagnosis not present

## 2017-02-06 DIAGNOSIS — I480 Paroxysmal atrial fibrillation: Secondary | ICD-10-CM | POA: Diagnosis not present

## 2017-02-07 DIAGNOSIS — M5137 Other intervertebral disc degeneration, lumbosacral region: Secondary | ICD-10-CM | POA: Diagnosis not present

## 2017-02-07 DIAGNOSIS — M5136 Other intervertebral disc degeneration, lumbar region: Secondary | ICD-10-CM | POA: Diagnosis not present

## 2017-02-07 DIAGNOSIS — M9903 Segmental and somatic dysfunction of lumbar region: Secondary | ICD-10-CM | POA: Diagnosis not present

## 2017-02-07 DIAGNOSIS — M9902 Segmental and somatic dysfunction of thoracic region: Secondary | ICD-10-CM | POA: Diagnosis not present

## 2017-02-07 DIAGNOSIS — M5442 Lumbago with sciatica, left side: Secondary | ICD-10-CM | POA: Diagnosis not present

## 2017-02-07 DIAGNOSIS — M9904 Segmental and somatic dysfunction of sacral region: Secondary | ICD-10-CM | POA: Diagnosis not present

## 2017-02-09 DIAGNOSIS — M5136 Other intervertebral disc degeneration, lumbar region: Secondary | ICD-10-CM | POA: Diagnosis not present

## 2017-02-09 DIAGNOSIS — M9903 Segmental and somatic dysfunction of lumbar region: Secondary | ICD-10-CM | POA: Diagnosis not present

## 2017-02-09 DIAGNOSIS — M9902 Segmental and somatic dysfunction of thoracic region: Secondary | ICD-10-CM | POA: Diagnosis not present

## 2017-02-09 DIAGNOSIS — M5137 Other intervertebral disc degeneration, lumbosacral region: Secondary | ICD-10-CM | POA: Diagnosis not present

## 2017-02-09 DIAGNOSIS — M5442 Lumbago with sciatica, left side: Secondary | ICD-10-CM | POA: Diagnosis not present

## 2017-02-09 DIAGNOSIS — M9904 Segmental and somatic dysfunction of sacral region: Secondary | ICD-10-CM | POA: Diagnosis not present

## 2017-02-14 DIAGNOSIS — M9902 Segmental and somatic dysfunction of thoracic region: Secondary | ICD-10-CM | POA: Diagnosis not present

## 2017-02-14 DIAGNOSIS — M9903 Segmental and somatic dysfunction of lumbar region: Secondary | ICD-10-CM | POA: Diagnosis not present

## 2017-02-14 DIAGNOSIS — M9904 Segmental and somatic dysfunction of sacral region: Secondary | ICD-10-CM | POA: Diagnosis not present

## 2017-02-14 DIAGNOSIS — M5137 Other intervertebral disc degeneration, lumbosacral region: Secondary | ICD-10-CM | POA: Diagnosis not present

## 2017-02-14 DIAGNOSIS — M5136 Other intervertebral disc degeneration, lumbar region: Secondary | ICD-10-CM | POA: Diagnosis not present

## 2017-02-14 DIAGNOSIS — M5442 Lumbago with sciatica, left side: Secondary | ICD-10-CM | POA: Diagnosis not present

## 2017-02-16 DIAGNOSIS — M9903 Segmental and somatic dysfunction of lumbar region: Secondary | ICD-10-CM | POA: Diagnosis not present

## 2017-02-16 DIAGNOSIS — M5137 Other intervertebral disc degeneration, lumbosacral region: Secondary | ICD-10-CM | POA: Diagnosis not present

## 2017-02-16 DIAGNOSIS — M9904 Segmental and somatic dysfunction of sacral region: Secondary | ICD-10-CM | POA: Diagnosis not present

## 2017-02-16 DIAGNOSIS — M5136 Other intervertebral disc degeneration, lumbar region: Secondary | ICD-10-CM | POA: Diagnosis not present

## 2017-02-16 DIAGNOSIS — M9902 Segmental and somatic dysfunction of thoracic region: Secondary | ICD-10-CM | POA: Diagnosis not present

## 2017-02-16 DIAGNOSIS — M5442 Lumbago with sciatica, left side: Secondary | ICD-10-CM | POA: Diagnosis not present

## 2017-02-21 DIAGNOSIS — M9904 Segmental and somatic dysfunction of sacral region: Secondary | ICD-10-CM | POA: Diagnosis not present

## 2017-02-21 DIAGNOSIS — M9903 Segmental and somatic dysfunction of lumbar region: Secondary | ICD-10-CM | POA: Diagnosis not present

## 2017-02-21 DIAGNOSIS — H5203 Hypermetropia, bilateral: Secondary | ICD-10-CM | POA: Diagnosis not present

## 2017-02-21 DIAGNOSIS — M5137 Other intervertebral disc degeneration, lumbosacral region: Secondary | ICD-10-CM | POA: Diagnosis not present

## 2017-02-21 DIAGNOSIS — Z961 Presence of intraocular lens: Secondary | ICD-10-CM | POA: Diagnosis not present

## 2017-02-21 DIAGNOSIS — H35443 Age-related reticular degeneration of retina, bilateral: Secondary | ICD-10-CM | POA: Diagnosis not present

## 2017-02-21 DIAGNOSIS — H52223 Regular astigmatism, bilateral: Secondary | ICD-10-CM | POA: Diagnosis not present

## 2017-02-21 DIAGNOSIS — H43813 Vitreous degeneration, bilateral: Secondary | ICD-10-CM | POA: Diagnosis not present

## 2017-02-21 DIAGNOSIS — H524 Presbyopia: Secondary | ICD-10-CM | POA: Diagnosis not present

## 2017-02-21 DIAGNOSIS — M9902 Segmental and somatic dysfunction of thoracic region: Secondary | ICD-10-CM | POA: Diagnosis not present

## 2017-02-21 DIAGNOSIS — M5442 Lumbago with sciatica, left side: Secondary | ICD-10-CM | POA: Diagnosis not present

## 2017-02-21 DIAGNOSIS — H43313 Vitreous membranes and strands, bilateral: Secondary | ICD-10-CM | POA: Diagnosis not present

## 2017-02-21 DIAGNOSIS — M5136 Other intervertebral disc degeneration, lumbar region: Secondary | ICD-10-CM | POA: Diagnosis not present

## 2017-02-23 DIAGNOSIS — M9904 Segmental and somatic dysfunction of sacral region: Secondary | ICD-10-CM | POA: Diagnosis not present

## 2017-02-23 DIAGNOSIS — M5136 Other intervertebral disc degeneration, lumbar region: Secondary | ICD-10-CM | POA: Diagnosis not present

## 2017-02-23 DIAGNOSIS — M5137 Other intervertebral disc degeneration, lumbosacral region: Secondary | ICD-10-CM | POA: Diagnosis not present

## 2017-02-23 DIAGNOSIS — M9902 Segmental and somatic dysfunction of thoracic region: Secondary | ICD-10-CM | POA: Diagnosis not present

## 2017-02-23 DIAGNOSIS — M5442 Lumbago with sciatica, left side: Secondary | ICD-10-CM | POA: Diagnosis not present

## 2017-02-23 DIAGNOSIS — M9903 Segmental and somatic dysfunction of lumbar region: Secondary | ICD-10-CM | POA: Diagnosis not present

## 2017-02-28 DIAGNOSIS — M9903 Segmental and somatic dysfunction of lumbar region: Secondary | ICD-10-CM | POA: Diagnosis not present

## 2017-02-28 DIAGNOSIS — M5136 Other intervertebral disc degeneration, lumbar region: Secondary | ICD-10-CM | POA: Diagnosis not present

## 2017-02-28 DIAGNOSIS — M5137 Other intervertebral disc degeneration, lumbosacral region: Secondary | ICD-10-CM | POA: Diagnosis not present

## 2017-02-28 DIAGNOSIS — M9904 Segmental and somatic dysfunction of sacral region: Secondary | ICD-10-CM | POA: Diagnosis not present

## 2017-02-28 DIAGNOSIS — M5442 Lumbago with sciatica, left side: Secondary | ICD-10-CM | POA: Diagnosis not present

## 2017-02-28 DIAGNOSIS — M9902 Segmental and somatic dysfunction of thoracic region: Secondary | ICD-10-CM | POA: Diagnosis not present

## 2017-03-02 DIAGNOSIS — M9902 Segmental and somatic dysfunction of thoracic region: Secondary | ICD-10-CM | POA: Diagnosis not present

## 2017-03-02 DIAGNOSIS — M5442 Lumbago with sciatica, left side: Secondary | ICD-10-CM | POA: Diagnosis not present

## 2017-03-02 DIAGNOSIS — M9903 Segmental and somatic dysfunction of lumbar region: Secondary | ICD-10-CM | POA: Diagnosis not present

## 2017-03-02 DIAGNOSIS — M5137 Other intervertebral disc degeneration, lumbosacral region: Secondary | ICD-10-CM | POA: Diagnosis not present

## 2017-03-02 DIAGNOSIS — M9904 Segmental and somatic dysfunction of sacral region: Secondary | ICD-10-CM | POA: Diagnosis not present

## 2017-03-02 DIAGNOSIS — M5136 Other intervertebral disc degeneration, lumbar region: Secondary | ICD-10-CM | POA: Diagnosis not present

## 2017-03-05 DIAGNOSIS — M9903 Segmental and somatic dysfunction of lumbar region: Secondary | ICD-10-CM | POA: Diagnosis not present

## 2017-03-05 DIAGNOSIS — M5137 Other intervertebral disc degeneration, lumbosacral region: Secondary | ICD-10-CM | POA: Diagnosis not present

## 2017-03-05 DIAGNOSIS — M9902 Segmental and somatic dysfunction of thoracic region: Secondary | ICD-10-CM | POA: Diagnosis not present

## 2017-03-05 DIAGNOSIS — M5442 Lumbago with sciatica, left side: Secondary | ICD-10-CM | POA: Diagnosis not present

## 2017-03-05 DIAGNOSIS — M5136 Other intervertebral disc degeneration, lumbar region: Secondary | ICD-10-CM | POA: Diagnosis not present

## 2017-03-05 DIAGNOSIS — M9904 Segmental and somatic dysfunction of sacral region: Secondary | ICD-10-CM | POA: Diagnosis not present

## 2017-03-07 DIAGNOSIS — M5136 Other intervertebral disc degeneration, lumbar region: Secondary | ICD-10-CM | POA: Diagnosis not present

## 2017-03-07 DIAGNOSIS — M9904 Segmental and somatic dysfunction of sacral region: Secondary | ICD-10-CM | POA: Diagnosis not present

## 2017-03-07 DIAGNOSIS — M5137 Other intervertebral disc degeneration, lumbosacral region: Secondary | ICD-10-CM | POA: Diagnosis not present

## 2017-03-07 DIAGNOSIS — M9902 Segmental and somatic dysfunction of thoracic region: Secondary | ICD-10-CM | POA: Diagnosis not present

## 2017-03-07 DIAGNOSIS — M5442 Lumbago with sciatica, left side: Secondary | ICD-10-CM | POA: Diagnosis not present

## 2017-03-07 DIAGNOSIS — M9903 Segmental and somatic dysfunction of lumbar region: Secondary | ICD-10-CM | POA: Diagnosis not present

## 2017-03-09 DIAGNOSIS — M5136 Other intervertebral disc degeneration, lumbar region: Secondary | ICD-10-CM | POA: Diagnosis not present

## 2017-03-09 DIAGNOSIS — M5442 Lumbago with sciatica, left side: Secondary | ICD-10-CM | POA: Diagnosis not present

## 2017-03-09 DIAGNOSIS — M9903 Segmental and somatic dysfunction of lumbar region: Secondary | ICD-10-CM | POA: Diagnosis not present

## 2017-03-09 DIAGNOSIS — M9902 Segmental and somatic dysfunction of thoracic region: Secondary | ICD-10-CM | POA: Diagnosis not present

## 2017-03-09 DIAGNOSIS — M5137 Other intervertebral disc degeneration, lumbosacral region: Secondary | ICD-10-CM | POA: Diagnosis not present

## 2017-03-09 DIAGNOSIS — M9904 Segmental and somatic dysfunction of sacral region: Secondary | ICD-10-CM | POA: Diagnosis not present

## 2017-03-12 DIAGNOSIS — M9903 Segmental and somatic dysfunction of lumbar region: Secondary | ICD-10-CM | POA: Diagnosis not present

## 2017-03-12 DIAGNOSIS — M5136 Other intervertebral disc degeneration, lumbar region: Secondary | ICD-10-CM | POA: Diagnosis not present

## 2017-03-12 DIAGNOSIS — M5137 Other intervertebral disc degeneration, lumbosacral region: Secondary | ICD-10-CM | POA: Diagnosis not present

## 2017-03-12 DIAGNOSIS — M5442 Lumbago with sciatica, left side: Secondary | ICD-10-CM | POA: Diagnosis not present

## 2017-03-12 DIAGNOSIS — M9904 Segmental and somatic dysfunction of sacral region: Secondary | ICD-10-CM | POA: Diagnosis not present

## 2017-03-12 DIAGNOSIS — M9902 Segmental and somatic dysfunction of thoracic region: Secondary | ICD-10-CM | POA: Diagnosis not present

## 2017-03-19 DIAGNOSIS — M5137 Other intervertebral disc degeneration, lumbosacral region: Secondary | ICD-10-CM | POA: Diagnosis not present

## 2017-03-19 DIAGNOSIS — M5136 Other intervertebral disc degeneration, lumbar region: Secondary | ICD-10-CM | POA: Diagnosis not present

## 2017-03-19 DIAGNOSIS — M9903 Segmental and somatic dysfunction of lumbar region: Secondary | ICD-10-CM | POA: Diagnosis not present

## 2017-03-19 DIAGNOSIS — M5442 Lumbago with sciatica, left side: Secondary | ICD-10-CM | POA: Diagnosis not present

## 2017-03-19 DIAGNOSIS — M9902 Segmental and somatic dysfunction of thoracic region: Secondary | ICD-10-CM | POA: Diagnosis not present

## 2017-03-19 DIAGNOSIS — M9904 Segmental and somatic dysfunction of sacral region: Secondary | ICD-10-CM | POA: Diagnosis not present

## 2017-03-26 DIAGNOSIS — M5136 Other intervertebral disc degeneration, lumbar region: Secondary | ICD-10-CM | POA: Diagnosis not present

## 2017-03-26 DIAGNOSIS — M9904 Segmental and somatic dysfunction of sacral region: Secondary | ICD-10-CM | POA: Diagnosis not present

## 2017-03-26 DIAGNOSIS — M5442 Lumbago with sciatica, left side: Secondary | ICD-10-CM | POA: Diagnosis not present

## 2017-03-26 DIAGNOSIS — M5137 Other intervertebral disc degeneration, lumbosacral region: Secondary | ICD-10-CM | POA: Diagnosis not present

## 2017-03-26 DIAGNOSIS — M9902 Segmental and somatic dysfunction of thoracic region: Secondary | ICD-10-CM | POA: Diagnosis not present

## 2017-03-26 DIAGNOSIS — M9903 Segmental and somatic dysfunction of lumbar region: Secondary | ICD-10-CM | POA: Diagnosis not present

## 2017-08-02 DIAGNOSIS — R001 Bradycardia, unspecified: Secondary | ICD-10-CM | POA: Diagnosis not present

## 2017-08-02 DIAGNOSIS — Z45018 Encounter for adjustment and management of other part of cardiac pacemaker: Secondary | ICD-10-CM | POA: Diagnosis not present

## 2017-08-06 DIAGNOSIS — R42 Dizziness and giddiness: Secondary | ICD-10-CM | POA: Diagnosis not present

## 2017-08-06 DIAGNOSIS — S62002A Unspecified fracture of navicular [scaphoid] bone of left wrist, initial encounter for closed fracture: Secondary | ICD-10-CM | POA: Diagnosis not present

## 2017-08-06 DIAGNOSIS — S199XXA Unspecified injury of neck, initial encounter: Secondary | ICD-10-CM | POA: Diagnosis not present

## 2017-08-06 DIAGNOSIS — M542 Cervicalgia: Secondary | ICD-10-CM | POA: Diagnosis not present

## 2017-08-06 DIAGNOSIS — S0990XA Unspecified injury of head, initial encounter: Secondary | ICD-10-CM | POA: Diagnosis not present

## 2017-08-06 DIAGNOSIS — S52502A Unspecified fracture of the lower end of left radius, initial encounter for closed fracture: Secondary | ICD-10-CM | POA: Diagnosis not present

## 2017-08-06 DIAGNOSIS — S52602A Unspecified fracture of lower end of left ulna, initial encounter for closed fracture: Secondary | ICD-10-CM | POA: Diagnosis not present

## 2017-08-06 DIAGNOSIS — S52122A Displaced fracture of head of left radius, initial encounter for closed fracture: Secondary | ICD-10-CM | POA: Diagnosis not present

## 2017-08-07 DIAGNOSIS — S52532A Colles' fracture of left radius, initial encounter for closed fracture: Secondary | ICD-10-CM | POA: Diagnosis not present

## 2017-08-08 DIAGNOSIS — S52572A Other intraarticular fracture of lower end of left radius, initial encounter for closed fracture: Secondary | ICD-10-CM | POA: Diagnosis not present

## 2017-08-08 DIAGNOSIS — Y999 Unspecified external cause status: Secondary | ICD-10-CM | POA: Diagnosis not present

## 2017-08-08 DIAGNOSIS — G8918 Other acute postprocedural pain: Secondary | ICD-10-CM | POA: Diagnosis not present

## 2017-08-23 DIAGNOSIS — J342 Deviated nasal septum: Secondary | ICD-10-CM | POA: Diagnosis not present

## 2017-08-23 DIAGNOSIS — H903 Sensorineural hearing loss, bilateral: Secondary | ICD-10-CM | POA: Diagnosis not present

## 2017-08-23 DIAGNOSIS — Z9181 History of falling: Secondary | ICD-10-CM | POA: Diagnosis not present

## 2017-08-23 DIAGNOSIS — R42 Dizziness and giddiness: Secondary | ICD-10-CM | POA: Diagnosis not present

## 2017-08-24 DIAGNOSIS — S52532D Colles' fracture of left radius, subsequent encounter for closed fracture with routine healing: Secondary | ICD-10-CM | POA: Diagnosis not present

## 2017-08-24 DIAGNOSIS — Z4789 Encounter for other orthopedic aftercare: Secondary | ICD-10-CM | POA: Diagnosis not present

## 2017-08-27 DIAGNOSIS — R42 Dizziness and giddiness: Secondary | ICD-10-CM | POA: Diagnosis not present

## 2017-08-27 DIAGNOSIS — R296 Repeated falls: Secondary | ICD-10-CM | POA: Diagnosis not present

## 2017-08-27 DIAGNOSIS — R2681 Unsteadiness on feet: Secondary | ICD-10-CM | POA: Diagnosis not present

## 2017-08-29 DIAGNOSIS — R296 Repeated falls: Secondary | ICD-10-CM | POA: Diagnosis not present

## 2017-08-29 DIAGNOSIS — R42 Dizziness and giddiness: Secondary | ICD-10-CM | POA: Diagnosis not present

## 2017-08-29 DIAGNOSIS — R2681 Unsteadiness on feet: Secondary | ICD-10-CM | POA: Diagnosis not present

## 2017-09-03 DIAGNOSIS — R296 Repeated falls: Secondary | ICD-10-CM | POA: Diagnosis not present

## 2017-09-03 DIAGNOSIS — R2681 Unsteadiness on feet: Secondary | ICD-10-CM | POA: Diagnosis not present

## 2017-09-03 DIAGNOSIS — R42 Dizziness and giddiness: Secondary | ICD-10-CM | POA: Diagnosis not present

## 2017-09-05 DIAGNOSIS — R296 Repeated falls: Secondary | ICD-10-CM | POA: Diagnosis not present

## 2017-09-05 DIAGNOSIS — R42 Dizziness and giddiness: Secondary | ICD-10-CM | POA: Diagnosis not present

## 2017-09-05 DIAGNOSIS — R2681 Unsteadiness on feet: Secondary | ICD-10-CM | POA: Diagnosis not present

## 2017-09-06 DIAGNOSIS — S52532D Colles' fracture of left radius, subsequent encounter for closed fracture with routine healing: Secondary | ICD-10-CM | POA: Diagnosis not present

## 2017-09-06 DIAGNOSIS — Z4789 Encounter for other orthopedic aftercare: Secondary | ICD-10-CM | POA: Diagnosis not present

## 2017-09-11 DIAGNOSIS — R296 Repeated falls: Secondary | ICD-10-CM | POA: Diagnosis not present

## 2017-09-11 DIAGNOSIS — R2681 Unsteadiness on feet: Secondary | ICD-10-CM | POA: Diagnosis not present

## 2017-09-11 DIAGNOSIS — R42 Dizziness and giddiness: Secondary | ICD-10-CM | POA: Diagnosis not present

## 2017-09-13 DIAGNOSIS — Z4789 Encounter for other orthopedic aftercare: Secondary | ICD-10-CM | POA: Diagnosis not present

## 2017-09-13 DIAGNOSIS — M25632 Stiffness of left wrist, not elsewhere classified: Secondary | ICD-10-CM | POA: Diagnosis not present

## 2017-09-13 DIAGNOSIS — M79642 Pain in left hand: Secondary | ICD-10-CM | POA: Diagnosis not present

## 2017-09-20 DIAGNOSIS — Z4789 Encounter for other orthopedic aftercare: Secondary | ICD-10-CM | POA: Diagnosis not present

## 2017-09-21 DIAGNOSIS — S51801A Unspecified open wound of right forearm, initial encounter: Secondary | ICD-10-CM | POA: Diagnosis not present

## 2017-09-24 DIAGNOSIS — Z79899 Other long term (current) drug therapy: Secondary | ICD-10-CM | POA: Diagnosis not present

## 2017-09-24 DIAGNOSIS — Z1231 Encounter for screening mammogram for malignant neoplasm of breast: Secondary | ICD-10-CM | POA: Diagnosis not present

## 2017-09-24 DIAGNOSIS — Z1331 Encounter for screening for depression: Secondary | ICD-10-CM | POA: Diagnosis not present

## 2017-09-24 DIAGNOSIS — M797 Fibromyalgia: Secondary | ICD-10-CM | POA: Diagnosis not present

## 2017-09-24 DIAGNOSIS — Z0001 Encounter for general adult medical examination with abnormal findings: Secondary | ICD-10-CM | POA: Diagnosis not present

## 2017-09-24 DIAGNOSIS — I2510 Atherosclerotic heart disease of native coronary artery without angina pectoris: Secondary | ICD-10-CM | POA: Diagnosis not present

## 2017-09-24 DIAGNOSIS — E785 Hyperlipidemia, unspecified: Secondary | ICD-10-CM | POA: Diagnosis not present

## 2017-09-24 DIAGNOSIS — Z1389 Encounter for screening for other disorder: Secondary | ICD-10-CM | POA: Diagnosis not present

## 2017-09-24 DIAGNOSIS — S41111A Laceration without foreign body of right upper arm, initial encounter: Secondary | ICD-10-CM | POA: Diagnosis not present

## 2017-09-24 DIAGNOSIS — E2839 Other primary ovarian failure: Secondary | ICD-10-CM | POA: Diagnosis not present

## 2017-09-24 DIAGNOSIS — Z23 Encounter for immunization: Secondary | ICD-10-CM | POA: Diagnosis not present

## 2017-09-24 DIAGNOSIS — Z1159 Encounter for screening for other viral diseases: Secondary | ICD-10-CM | POA: Diagnosis not present

## 2017-09-24 DIAGNOSIS — D696 Thrombocytopenia, unspecified: Secondary | ICD-10-CM | POA: Diagnosis not present

## 2017-10-04 DIAGNOSIS — S52532D Colles' fracture of left radius, subsequent encounter for closed fracture with routine healing: Secondary | ICD-10-CM | POA: Diagnosis not present

## 2017-10-09 DIAGNOSIS — S41101D Unspecified open wound of right upper arm, subsequent encounter: Secondary | ICD-10-CM | POA: Diagnosis not present

## 2017-10-19 DIAGNOSIS — J209 Acute bronchitis, unspecified: Secondary | ICD-10-CM | POA: Diagnosis not present

## 2017-11-20 DIAGNOSIS — S52532A Colles' fracture of left radius, initial encounter for closed fracture: Secondary | ICD-10-CM | POA: Diagnosis not present

## 2017-11-20 DIAGNOSIS — I471 Supraventricular tachycardia: Secondary | ICD-10-CM | POA: Diagnosis not present

## 2017-11-20 DIAGNOSIS — Z950 Presence of cardiac pacemaker: Secondary | ICD-10-CM | POA: Diagnosis not present

## 2017-11-20 DIAGNOSIS — Z45018 Encounter for adjustment and management of other part of cardiac pacemaker: Secondary | ICD-10-CM | POA: Diagnosis not present

## 2017-11-28 DIAGNOSIS — I471 Supraventricular tachycardia: Secondary | ICD-10-CM | POA: Diagnosis not present

## 2017-11-28 DIAGNOSIS — Z950 Presence of cardiac pacemaker: Secondary | ICD-10-CM | POA: Diagnosis not present

## 2017-11-30 DIAGNOSIS — S81811A Laceration without foreign body, right lower leg, initial encounter: Secondary | ICD-10-CM | POA: Diagnosis not present

## 2017-12-29 DIAGNOSIS — L03115 Cellulitis of right lower limb: Secondary | ICD-10-CM | POA: Diagnosis not present

## 2018-01-16 DIAGNOSIS — N3001 Acute cystitis with hematuria: Secondary | ICD-10-CM | POA: Diagnosis not present

## 2018-01-16 DIAGNOSIS — R319 Hematuria, unspecified: Secondary | ICD-10-CM | POA: Diagnosis not present

## 2018-02-06 DIAGNOSIS — R413 Other amnesia: Secondary | ICD-10-CM | POA: Diagnosis not present

## 2018-02-06 DIAGNOSIS — R2689 Other abnormalities of gait and mobility: Secondary | ICD-10-CM | POA: Diagnosis not present

## 2018-02-06 DIAGNOSIS — Z6832 Body mass index (BMI) 32.0-32.9, adult: Secondary | ICD-10-CM | POA: Diagnosis not present

## 2018-02-06 DIAGNOSIS — F419 Anxiety disorder, unspecified: Secondary | ICD-10-CM | POA: Diagnosis not present

## 2018-02-06 DIAGNOSIS — R42 Dizziness and giddiness: Secondary | ICD-10-CM | POA: Diagnosis not present

## 2018-03-05 DIAGNOSIS — S52502D Unspecified fracture of the lower end of left radius, subsequent encounter for closed fracture with routine healing: Secondary | ICD-10-CM | POA: Diagnosis not present

## 2018-03-20 DIAGNOSIS — R300 Dysuria: Secondary | ICD-10-CM | POA: Diagnosis not present

## 2018-03-29 DIAGNOSIS — E669 Obesity, unspecified: Secondary | ICD-10-CM | POA: Diagnosis not present

## 2018-03-29 DIAGNOSIS — Z79899 Other long term (current) drug therapy: Secondary | ICD-10-CM | POA: Diagnosis not present

## 2018-03-29 DIAGNOSIS — I480 Paroxysmal atrial fibrillation: Secondary | ICD-10-CM | POA: Diagnosis not present

## 2018-03-29 DIAGNOSIS — R310 Gross hematuria: Secondary | ICD-10-CM | POA: Diagnosis not present

## 2018-03-29 DIAGNOSIS — Z6832 Body mass index (BMI) 32.0-32.9, adult: Secondary | ICD-10-CM | POA: Diagnosis not present

## 2018-03-29 DIAGNOSIS — R2689 Other abnormalities of gait and mobility: Secondary | ICD-10-CM | POA: Diagnosis not present

## 2018-03-29 DIAGNOSIS — D696 Thrombocytopenia, unspecified: Secondary | ICD-10-CM | POA: Diagnosis not present

## 2018-04-17 DIAGNOSIS — I471 Supraventricular tachycardia: Secondary | ICD-10-CM | POA: Diagnosis not present

## 2018-04-17 DIAGNOSIS — Z950 Presence of cardiac pacemaker: Secondary | ICD-10-CM | POA: Diagnosis not present

## 2018-04-17 DIAGNOSIS — G4733 Obstructive sleep apnea (adult) (pediatric): Secondary | ICD-10-CM | POA: Diagnosis not present

## 2018-04-17 DIAGNOSIS — N3289 Other specified disorders of bladder: Secondary | ICD-10-CM | POA: Diagnosis not present

## 2018-04-17 DIAGNOSIS — J449 Chronic obstructive pulmonary disease, unspecified: Secondary | ICD-10-CM | POA: Diagnosis not present

## 2018-04-17 DIAGNOSIS — R310 Gross hematuria: Secondary | ICD-10-CM | POA: Diagnosis not present

## 2018-04-17 DIAGNOSIS — I480 Paroxysmal atrial fibrillation: Secondary | ICD-10-CM | POA: Diagnosis not present

## 2018-04-17 DIAGNOSIS — M7989 Other specified soft tissue disorders: Secondary | ICD-10-CM | POA: Diagnosis not present
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
## 2018-05-28 DIAGNOSIS — G4733 Obstructive sleep apnea (adult) (pediatric): Secondary | ICD-10-CM | POA: Diagnosis not present

## 2018-05-28 DIAGNOSIS — J310 Chronic rhinitis: Secondary | ICD-10-CM | POA: Diagnosis not present

## 2018-05-28 DIAGNOSIS — R5383 Other fatigue: Secondary | ICD-10-CM | POA: Diagnosis not present

## 2018-05-28 DIAGNOSIS — J4530 Mild persistent asthma, uncomplicated: Secondary | ICD-10-CM | POA: Diagnosis not present

## 2018-05-28 DIAGNOSIS — R310 Gross hematuria: Secondary | ICD-10-CM | POA: Diagnosis not present

## 2018-05-29 DIAGNOSIS — N289 Disorder of kidney and ureter, unspecified: Secondary | ICD-10-CM | POA: Diagnosis not present

## 2018-05-29 DIAGNOSIS — R911 Solitary pulmonary nodule: Secondary | ICD-10-CM | POA: Diagnosis not present

## 2018-05-29 DIAGNOSIS — I700 Atherosclerosis of aorta: Secondary | ICD-10-CM | POA: Diagnosis not present

## 2018-05-29 DIAGNOSIS — I2510 Atherosclerotic heart disease of native coronary artery without angina pectoris: Secondary | ICD-10-CM | POA: Diagnosis not present

## 2018-05-29 DIAGNOSIS — R310 Gross hematuria: Secondary | ICD-10-CM | POA: Diagnosis not present

## 2018-06-05 DIAGNOSIS — D0472 Carcinoma in situ of skin of left lower limb, including hip: Secondary | ICD-10-CM | POA: Diagnosis not present

## 2018-06-05 DIAGNOSIS — D0471 Carcinoma in situ of skin of right lower limb, including hip: Secondary | ICD-10-CM | POA: Diagnosis not present

## 2018-06-05 DIAGNOSIS — L570 Actinic keratosis: Secondary | ICD-10-CM | POA: Diagnosis not present

## 2018-06-10 DIAGNOSIS — G4733 Obstructive sleep apnea (adult) (pediatric): Secondary | ICD-10-CM | POA: Diagnosis not present

## 2018-06-13 DIAGNOSIS — Z6831 Body mass index (BMI) 31.0-31.9, adult: Secondary | ICD-10-CM | POA: Diagnosis not present

## 2018-06-13 DIAGNOSIS — R6883 Chills (without fever): Secondary | ICD-10-CM | POA: Diagnosis not present

## 2018-06-13 DIAGNOSIS — F419 Anxiety disorder, unspecified: Secondary | ICD-10-CM | POA: Diagnosis not present

## 2018-06-13 DIAGNOSIS — E669 Obesity, unspecified: Secondary | ICD-10-CM | POA: Diagnosis not present

## 2018-06-14 DIAGNOSIS — R5383 Other fatigue: Secondary | ICD-10-CM | POA: Diagnosis not present

## 2018-06-14 DIAGNOSIS — J4530 Mild persistent asthma, uncomplicated: Secondary | ICD-10-CM | POA: Diagnosis not present

## 2018-06-14 DIAGNOSIS — G4733 Obstructive sleep apnea (adult) (pediatric): Secondary | ICD-10-CM | POA: Diagnosis not present

## 2018-06-14 DIAGNOSIS — J310 Chronic rhinitis: Secondary | ICD-10-CM | POA: Diagnosis not present

## 2018-06-17 DIAGNOSIS — M542 Cervicalgia: Secondary | ICD-10-CM | POA: Diagnosis not present

## 2018-06-17 DIAGNOSIS — M546 Pain in thoracic spine: Secondary | ICD-10-CM | POA: Diagnosis not present

## 2018-07-13 DIAGNOSIS — J209 Acute bronchitis, unspecified: Secondary | ICD-10-CM | POA: Diagnosis not present

## 2018-07-26 DIAGNOSIS — J310 Chronic rhinitis: Secondary | ICD-10-CM | POA: Diagnosis not present

## 2018-07-26 DIAGNOSIS — J4530 Mild persistent asthma, uncomplicated: Secondary | ICD-10-CM | POA: Diagnosis not present

## 2018-07-26 DIAGNOSIS — G4733 Obstructive sleep apnea (adult) (pediatric): Secondary | ICD-10-CM | POA: Diagnosis not present

## 2018-07-26 DIAGNOSIS — R5383 Other fatigue: Secondary | ICD-10-CM | POA: Diagnosis not present

## 2018-08-01 DIAGNOSIS — J209 Acute bronchitis, unspecified: Secondary | ICD-10-CM | POA: Diagnosis not present

## 2018-08-08 DIAGNOSIS — R51 Headache: Secondary | ICD-10-CM | POA: Diagnosis not present

## 2018-08-08 DIAGNOSIS — J019 Acute sinusitis, unspecified: Secondary | ICD-10-CM | POA: Diagnosis not present

## 2018-08-08 DIAGNOSIS — J310 Chronic rhinitis: Secondary | ICD-10-CM | POA: Diagnosis not present

## 2018-08-08 DIAGNOSIS — Z6831 Body mass index (BMI) 31.0-31.9, adult: Secondary | ICD-10-CM | POA: Diagnosis not present

## 2018-08-08 DIAGNOSIS — Z79899 Other long term (current) drug therapy: Secondary | ICD-10-CM | POA: Diagnosis not present

## 2018-08-08 DIAGNOSIS — D696 Thrombocytopenia, unspecified: Secondary | ICD-10-CM | POA: Diagnosis not present

## 2018-08-26 DIAGNOSIS — Z950 Presence of cardiac pacemaker: Secondary | ICD-10-CM | POA: Diagnosis not present

## 2018-09-18 DIAGNOSIS — G4733 Obstructive sleep apnea (adult) (pediatric): Secondary | ICD-10-CM | POA: Diagnosis not present

## 2018-09-30 DIAGNOSIS — Z6831 Body mass index (BMI) 31.0-31.9, adult: Secondary | ICD-10-CM | POA: Diagnosis not present

## 2018-09-30 DIAGNOSIS — D696 Thrombocytopenia, unspecified: Secondary | ICD-10-CM | POA: Diagnosis not present

## 2018-09-30 DIAGNOSIS — M797 Fibromyalgia: Secondary | ICD-10-CM | POA: Diagnosis not present

## 2018-09-30 DIAGNOSIS — I2510 Atherosclerotic heart disease of native coronary artery without angina pectoris: Secondary | ICD-10-CM | POA: Diagnosis not present

## 2018-09-30 DIAGNOSIS — J310 Chronic rhinitis: Secondary | ICD-10-CM | POA: Diagnosis not present

## 2018-09-30 DIAGNOSIS — J029 Acute pharyngitis, unspecified: Secondary | ICD-10-CM | POA: Diagnosis not present

## 2018-10-25 DIAGNOSIS — R5383 Other fatigue: Secondary | ICD-10-CM | POA: Diagnosis not present

## 2018-10-25 DIAGNOSIS — J310 Chronic rhinitis: Secondary | ICD-10-CM | POA: Diagnosis not present

## 2018-10-25 DIAGNOSIS — G4733 Obstructive sleep apnea (adult) (pediatric): Secondary | ICD-10-CM | POA: Diagnosis not present

## 2018-10-25 DIAGNOSIS — J4530 Mild persistent asthma, uncomplicated: Secondary | ICD-10-CM | POA: Diagnosis not present

## 2018-10-31 DIAGNOSIS — Z45018 Encounter for adjustment and management of other part of cardiac pacemaker: Secondary | ICD-10-CM | POA: Diagnosis not present

## 2018-10-31 DIAGNOSIS — R001 Bradycardia, unspecified: Secondary | ICD-10-CM | POA: Diagnosis not present

## 2018-11-01 DIAGNOSIS — G4733 Obstructive sleep apnea (adult) (pediatric): Secondary | ICD-10-CM | POA: Diagnosis not present

## 2018-11-04 DIAGNOSIS — L570 Actinic keratosis: Secondary | ICD-10-CM | POA: Diagnosis not present

## 2018-12-05 DIAGNOSIS — J4530 Mild persistent asthma, uncomplicated: Secondary | ICD-10-CM | POA: Diagnosis not present

## 2018-12-05 DIAGNOSIS — J310 Chronic rhinitis: Secondary | ICD-10-CM | POA: Diagnosis not present

## 2018-12-05 DIAGNOSIS — R5383 Other fatigue: Secondary | ICD-10-CM | POA: Diagnosis not present

## 2018-12-05 DIAGNOSIS — G4733 Obstructive sleep apnea (adult) (pediatric): Secondary | ICD-10-CM | POA: Diagnosis not present

## 2018-12-10 DIAGNOSIS — M5136 Other intervertebral disc degeneration, lumbar region: Secondary | ICD-10-CM | POA: Diagnosis not present

## 2018-12-10 DIAGNOSIS — M9902 Segmental and somatic dysfunction of thoracic region: Secondary | ICD-10-CM | POA: Diagnosis not present

## 2018-12-10 DIAGNOSIS — M5442 Lumbago with sciatica, left side: Secondary | ICD-10-CM | POA: Diagnosis not present

## 2018-12-10 DIAGNOSIS — M9903 Segmental and somatic dysfunction of lumbar region: Secondary | ICD-10-CM | POA: Diagnosis not present

## 2018-12-10 DIAGNOSIS — M5137 Other intervertebral disc degeneration, lumbosacral region: Secondary | ICD-10-CM | POA: Diagnosis not present

## 2018-12-10 DIAGNOSIS — M9904 Segmental and somatic dysfunction of sacral region: Secondary | ICD-10-CM | POA: Diagnosis not present

## 2018-12-12 DIAGNOSIS — M9903 Segmental and somatic dysfunction of lumbar region: Secondary | ICD-10-CM | POA: Diagnosis not present

## 2018-12-12 DIAGNOSIS — M9902 Segmental and somatic dysfunction of thoracic region: Secondary | ICD-10-CM | POA: Diagnosis not present

## 2018-12-12 DIAGNOSIS — M5136 Other intervertebral disc degeneration, lumbar region: Secondary | ICD-10-CM | POA: Diagnosis not present

## 2018-12-12 DIAGNOSIS — M5442 Lumbago with sciatica, left side: Secondary | ICD-10-CM | POA: Diagnosis not present

## 2018-12-12 DIAGNOSIS — M9904 Segmental and somatic dysfunction of sacral region: Secondary | ICD-10-CM | POA: Diagnosis not present

## 2018-12-12 DIAGNOSIS — M5137 Other intervertebral disc degeneration, lumbosacral region: Secondary | ICD-10-CM | POA: Diagnosis not present

## 2018-12-17 DIAGNOSIS — G4733 Obstructive sleep apnea (adult) (pediatric): Secondary | ICD-10-CM | POA: Diagnosis not present

## 2018-12-23 DIAGNOSIS — J310 Chronic rhinitis: Secondary | ICD-10-CM | POA: Diagnosis not present

## 2018-12-25 DIAGNOSIS — J310 Chronic rhinitis: Secondary | ICD-10-CM | POA: Diagnosis not present

## 2019-01-15 DIAGNOSIS — J310 Chronic rhinitis: Secondary | ICD-10-CM | POA: Diagnosis not present

## 2019-01-22 DIAGNOSIS — D696 Thrombocytopenia, unspecified: Secondary | ICD-10-CM | POA: Diagnosis not present

## 2019-01-22 DIAGNOSIS — Z23 Encounter for immunization: Secondary | ICD-10-CM | POA: Diagnosis not present

## 2019-01-22 DIAGNOSIS — E785 Hyperlipidemia, unspecified: Secondary | ICD-10-CM | POA: Diagnosis not present

## 2019-01-22 DIAGNOSIS — Z1331 Encounter for screening for depression: Secondary | ICD-10-CM | POA: Diagnosis not present

## 2019-01-22 DIAGNOSIS — Z1339 Encounter for screening examination for other mental health and behavioral disorders: Secondary | ICD-10-CM | POA: Diagnosis not present

## 2019-01-22 DIAGNOSIS — Z79899 Other long term (current) drug therapy: Secondary | ICD-10-CM | POA: Diagnosis not present

## 2019-01-22 DIAGNOSIS — Z0000 Encounter for general adult medical examination without abnormal findings: Secondary | ICD-10-CM | POA: Diagnosis not present

## 2019-01-22 DIAGNOSIS — R51 Headache: Secondary | ICD-10-CM | POA: Diagnosis not present

## 2019-01-22 DIAGNOSIS — Z1231 Encounter for screening mammogram for malignant neoplasm of breast: Secondary | ICD-10-CM | POA: Diagnosis not present

## 2019-01-22 DIAGNOSIS — Z6834 Body mass index (BMI) 34.0-34.9, adult: Secondary | ICD-10-CM | POA: Diagnosis not present

## 2019-01-24 DIAGNOSIS — J310 Chronic rhinitis: Secondary | ICD-10-CM | POA: Diagnosis not present

## 2019-01-29 DIAGNOSIS — J310 Chronic rhinitis: Secondary | ICD-10-CM | POA: Diagnosis not present

## 2019-02-05 DIAGNOSIS — J310 Chronic rhinitis: Secondary | ICD-10-CM | POA: Diagnosis not present

## 2019-02-07 DIAGNOSIS — Z1231 Encounter for screening mammogram for malignant neoplasm of breast: Secondary | ICD-10-CM | POA: Diagnosis not present

## 2019-02-19 DIAGNOSIS — J310 Chronic rhinitis: Secondary | ICD-10-CM | POA: Diagnosis not present

## 2019-02-21 DIAGNOSIS — H903 Sensorineural hearing loss, bilateral: Secondary | ICD-10-CM | POA: Diagnosis not present

## 2019-02-25 DIAGNOSIS — R519 Headache, unspecified: Secondary | ICD-10-CM | POA: Diagnosis not present

## 2019-02-25 DIAGNOSIS — L259 Unspecified contact dermatitis, unspecified cause: Secondary | ICD-10-CM | POA: Diagnosis not present

## 2019-02-26 DIAGNOSIS — J310 Chronic rhinitis: Secondary | ICD-10-CM | POA: Diagnosis not present

## 2019-03-05 DIAGNOSIS — S199XXA Unspecified injury of neck, initial encounter: Secondary | ICD-10-CM | POA: Diagnosis not present

## 2019-03-05 DIAGNOSIS — R1110 Vomiting, unspecified: Secondary | ICD-10-CM | POA: Diagnosis not present

## 2019-03-05 DIAGNOSIS — R030 Elevated blood-pressure reading, without diagnosis of hypertension: Secondary | ICD-10-CM | POA: Diagnosis not present

## 2019-03-05 DIAGNOSIS — S0990XA Unspecified injury of head, initial encounter: Secondary | ICD-10-CM | POA: Diagnosis not present

## 2019-03-05 DIAGNOSIS — R300 Dysuria: Secondary | ICD-10-CM | POA: Diagnosis not present

## 2019-03-05 DIAGNOSIS — R112 Nausea with vomiting, unspecified: Secondary | ICD-10-CM | POA: Diagnosis not present

## 2019-03-05 DIAGNOSIS — R109 Unspecified abdominal pain: Secondary | ICD-10-CM | POA: Diagnosis not present

## 2019-03-05 DIAGNOSIS — R1084 Generalized abdominal pain: Secondary | ICD-10-CM | POA: Diagnosis not present

## 2019-03-07 DIAGNOSIS — R1013 Epigastric pain: Secondary | ICD-10-CM | POA: Diagnosis not present

## 2019-03-07 DIAGNOSIS — R110 Nausea: Secondary | ICD-10-CM | POA: Diagnosis not present

## 2019-03-07 DIAGNOSIS — Z6833 Body mass index (BMI) 33.0-33.9, adult: Secondary | ICD-10-CM | POA: Diagnosis not present

## 2019-03-10 DIAGNOSIS — M9904 Segmental and somatic dysfunction of sacral region: Secondary | ICD-10-CM | POA: Diagnosis not present

## 2019-03-10 DIAGNOSIS — M5137 Other intervertebral disc degeneration, lumbosacral region: Secondary | ICD-10-CM | POA: Diagnosis not present

## 2019-03-10 DIAGNOSIS — M5442 Lumbago with sciatica, left side: Secondary | ICD-10-CM | POA: Diagnosis not present

## 2019-03-10 DIAGNOSIS — M9903 Segmental and somatic dysfunction of lumbar region: Secondary | ICD-10-CM | POA: Diagnosis not present

## 2019-03-10 DIAGNOSIS — M5136 Other intervertebral disc degeneration, lumbar region: Secondary | ICD-10-CM | POA: Diagnosis not present

## 2019-03-10 DIAGNOSIS — M9902 Segmental and somatic dysfunction of thoracic region: Secondary | ICD-10-CM | POA: Diagnosis not present

## 2019-03-12 DIAGNOSIS — M545 Low back pain: Secondary | ICD-10-CM | POA: Diagnosis not present

## 2019-03-12 DIAGNOSIS — M5137 Other intervertebral disc degeneration, lumbosacral region: Secondary | ICD-10-CM | POA: Diagnosis not present

## 2019-03-12 DIAGNOSIS — M9904 Segmental and somatic dysfunction of sacral region: Secondary | ICD-10-CM | POA: Diagnosis not present

## 2019-03-12 DIAGNOSIS — M9902 Segmental and somatic dysfunction of thoracic region: Secondary | ICD-10-CM | POA: Diagnosis not present

## 2019-03-12 DIAGNOSIS — M5136 Other intervertebral disc degeneration, lumbar region: Secondary | ICD-10-CM | POA: Diagnosis not present

## 2019-03-12 DIAGNOSIS — Z1331 Encounter for screening for depression: Secondary | ICD-10-CM | POA: Diagnosis not present

## 2019-03-12 DIAGNOSIS — Z6833 Body mass index (BMI) 33.0-33.9, adult: Secondary | ICD-10-CM | POA: Diagnosis not present

## 2019-03-12 DIAGNOSIS — J449 Chronic obstructive pulmonary disease, unspecified: Secondary | ICD-10-CM | POA: Diagnosis not present

## 2019-03-12 DIAGNOSIS — M9903 Segmental and somatic dysfunction of lumbar region: Secondary | ICD-10-CM | POA: Diagnosis not present

## 2019-03-12 DIAGNOSIS — I2510 Atherosclerotic heart disease of native coronary artery without angina pectoris: Secondary | ICD-10-CM | POA: Diagnosis not present

## 2019-03-12 DIAGNOSIS — M5442 Lumbago with sciatica, left side: Secondary | ICD-10-CM | POA: Diagnosis not present

## 2019-03-12 DIAGNOSIS — I480 Paroxysmal atrial fibrillation: Secondary | ICD-10-CM | POA: Diagnosis not present

## 2019-03-12 DIAGNOSIS — E669 Obesity, unspecified: Secondary | ICD-10-CM | POA: Diagnosis not present

## 2019-03-13 DIAGNOSIS — M9902 Segmental and somatic dysfunction of thoracic region: Secondary | ICD-10-CM | POA: Diagnosis not present

## 2019-03-13 DIAGNOSIS — M9904 Segmental and somatic dysfunction of sacral region: Secondary | ICD-10-CM | POA: Diagnosis not present

## 2019-03-13 DIAGNOSIS — M5136 Other intervertebral disc degeneration, lumbar region: Secondary | ICD-10-CM | POA: Diagnosis not present

## 2019-03-13 DIAGNOSIS — M5442 Lumbago with sciatica, left side: Secondary | ICD-10-CM | POA: Diagnosis not present

## 2019-03-13 DIAGNOSIS — M5137 Other intervertebral disc degeneration, lumbosacral region: Secondary | ICD-10-CM | POA: Diagnosis not present

## 2019-03-13 DIAGNOSIS — M9903 Segmental and somatic dysfunction of lumbar region: Secondary | ICD-10-CM | POA: Diagnosis not present

## 2019-03-14 DIAGNOSIS — H903 Sensorineural hearing loss, bilateral: Secondary | ICD-10-CM | POA: Diagnosis not present

## 2019-03-17 DIAGNOSIS — M9903 Segmental and somatic dysfunction of lumbar region: Secondary | ICD-10-CM | POA: Diagnosis not present

## 2019-03-17 DIAGNOSIS — M9902 Segmental and somatic dysfunction of thoracic region: Secondary | ICD-10-CM | POA: Diagnosis not present

## 2019-03-17 DIAGNOSIS — M5136 Other intervertebral disc degeneration, lumbar region: Secondary | ICD-10-CM | POA: Diagnosis not present

## 2019-03-17 DIAGNOSIS — M5442 Lumbago with sciatica, left side: Secondary | ICD-10-CM | POA: Diagnosis not present

## 2019-03-17 DIAGNOSIS — M9904 Segmental and somatic dysfunction of sacral region: Secondary | ICD-10-CM | POA: Diagnosis not present

## 2019-03-17 DIAGNOSIS — M5137 Other intervertebral disc degeneration, lumbosacral region: Secondary | ICD-10-CM | POA: Diagnosis not present

## 2019-03-18 DIAGNOSIS — M5442 Lumbago with sciatica, left side: Secondary | ICD-10-CM | POA: Diagnosis not present

## 2019-03-18 DIAGNOSIS — M9902 Segmental and somatic dysfunction of thoracic region: Secondary | ICD-10-CM | POA: Diagnosis not present

## 2019-03-18 DIAGNOSIS — M5136 Other intervertebral disc degeneration, lumbar region: Secondary | ICD-10-CM | POA: Diagnosis not present

## 2019-03-18 DIAGNOSIS — M5137 Other intervertebral disc degeneration, lumbosacral region: Secondary | ICD-10-CM | POA: Diagnosis not present

## 2019-03-18 DIAGNOSIS — M9903 Segmental and somatic dysfunction of lumbar region: Secondary | ICD-10-CM | POA: Diagnosis not present

## 2019-03-18 DIAGNOSIS — M9904 Segmental and somatic dysfunction of sacral region: Secondary | ICD-10-CM | POA: Diagnosis not present

## 2019-03-21 DIAGNOSIS — M9903 Segmental and somatic dysfunction of lumbar region: Secondary | ICD-10-CM | POA: Diagnosis not present

## 2019-03-21 DIAGNOSIS — M9904 Segmental and somatic dysfunction of sacral region: Secondary | ICD-10-CM | POA: Diagnosis not present

## 2019-03-21 DIAGNOSIS — M9902 Segmental and somatic dysfunction of thoracic region: Secondary | ICD-10-CM | POA: Diagnosis not present

## 2019-03-21 DIAGNOSIS — M5137 Other intervertebral disc degeneration, lumbosacral region: Secondary | ICD-10-CM | POA: Diagnosis not present

## 2019-03-21 DIAGNOSIS — M5136 Other intervertebral disc degeneration, lumbar region: Secondary | ICD-10-CM | POA: Diagnosis not present

## 2019-03-21 DIAGNOSIS — M5442 Lumbago with sciatica, left side: Secondary | ICD-10-CM | POA: Diagnosis not present

## 2019-03-24 DIAGNOSIS — M9902 Segmental and somatic dysfunction of thoracic region: Secondary | ICD-10-CM | POA: Diagnosis not present

## 2019-03-24 DIAGNOSIS — M9903 Segmental and somatic dysfunction of lumbar region: Secondary | ICD-10-CM | POA: Diagnosis not present

## 2019-03-24 DIAGNOSIS — M5136 Other intervertebral disc degeneration, lumbar region: Secondary | ICD-10-CM | POA: Diagnosis not present

## 2019-03-24 DIAGNOSIS — M5442 Lumbago with sciatica, left side: Secondary | ICD-10-CM | POA: Diagnosis not present

## 2019-03-24 DIAGNOSIS — M9904 Segmental and somatic dysfunction of sacral region: Secondary | ICD-10-CM | POA: Diagnosis not present

## 2019-03-24 DIAGNOSIS — M5137 Other intervertebral disc degeneration, lumbosacral region: Secondary | ICD-10-CM | POA: Diagnosis not present

## 2019-03-25 DIAGNOSIS — M5136 Other intervertebral disc degeneration, lumbar region: Secondary | ICD-10-CM | POA: Diagnosis not present

## 2019-03-25 DIAGNOSIS — M5442 Lumbago with sciatica, left side: Secondary | ICD-10-CM | POA: Diagnosis not present

## 2019-03-25 DIAGNOSIS — M9903 Segmental and somatic dysfunction of lumbar region: Secondary | ICD-10-CM | POA: Diagnosis not present

## 2019-03-25 DIAGNOSIS — M9902 Segmental and somatic dysfunction of thoracic region: Secondary | ICD-10-CM | POA: Diagnosis not present

## 2019-03-25 DIAGNOSIS — M5137 Other intervertebral disc degeneration, lumbosacral region: Secondary | ICD-10-CM | POA: Diagnosis not present

## 2019-03-25 DIAGNOSIS — M9904 Segmental and somatic dysfunction of sacral region: Secondary | ICD-10-CM | POA: Diagnosis not present

## 2019-04-09 DIAGNOSIS — J310 Chronic rhinitis: Secondary | ICD-10-CM | POA: Diagnosis not present

## 2019-04-23 DIAGNOSIS — J310 Chronic rhinitis: Secondary | ICD-10-CM | POA: Diagnosis not present

## 2019-04-30 DIAGNOSIS — J310 Chronic rhinitis: Secondary | ICD-10-CM | POA: Diagnosis not present

## 2019-05-02 DIAGNOSIS — S80211A Abrasion, right knee, initial encounter: Secondary | ICD-10-CM | POA: Diagnosis not present

## 2019-05-05 DIAGNOSIS — J9610 Chronic respiratory failure, unspecified whether with hypoxia or hypercapnia: Secondary | ICD-10-CM | POA: Diagnosis not present

## 2019-05-05 DIAGNOSIS — S0990XA Unspecified injury of head, initial encounter: Secondary | ICD-10-CM | POA: Diagnosis not present

## 2019-05-05 DIAGNOSIS — Y9301 Activity, walking, marching and hiking: Secondary | ICD-10-CM | POA: Diagnosis not present

## 2019-05-05 DIAGNOSIS — R2689 Other abnormalities of gait and mobility: Secondary | ICD-10-CM | POA: Diagnosis not present

## 2019-05-05 DIAGNOSIS — L740 Miliaria rubra: Secondary | ICD-10-CM | POA: Diagnosis not present

## 2019-05-05 DIAGNOSIS — Z8262 Family history of osteoporosis: Secondary | ICD-10-CM | POA: Diagnosis not present

## 2019-05-05 DIAGNOSIS — S299XXA Unspecified injury of thorax, initial encounter: Secondary | ICD-10-CM | POA: Diagnosis not present

## 2019-05-05 DIAGNOSIS — I471 Supraventricular tachycardia: Secondary | ICD-10-CM | POA: Diagnosis not present

## 2019-05-05 DIAGNOSIS — R634 Abnormal weight loss: Secondary | ICD-10-CM | POA: Diagnosis not present

## 2019-05-05 DIAGNOSIS — T887XXA Unspecified adverse effect of drug or medicament, initial encounter: Secondary | ICD-10-CM | POA: Diagnosis not present

## 2019-05-05 DIAGNOSIS — Y998 Other external cause status: Secondary | ICD-10-CM | POA: Diagnosis not present

## 2019-05-05 DIAGNOSIS — Z950 Presence of cardiac pacemaker: Secondary | ICD-10-CM | POA: Diagnosis not present

## 2019-05-05 DIAGNOSIS — M8580 Other specified disorders of bone density and structure, unspecified site: Secondary | ICD-10-CM | POA: Diagnosis not present

## 2019-05-05 DIAGNOSIS — S32592A Other specified fracture of left pubis, initial encounter for closed fracture: Secondary | ICD-10-CM | POA: Diagnosis not present

## 2019-05-05 DIAGNOSIS — M47817 Spondylosis without myelopathy or radiculopathy, lumbosacral region: Secondary | ICD-10-CM | POA: Diagnosis not present

## 2019-05-05 DIAGNOSIS — Z20828 Contact with and (suspected) exposure to other viral communicable diseases: Secondary | ICD-10-CM | POA: Diagnosis not present

## 2019-05-05 DIAGNOSIS — W1830XA Fall on same level, unspecified, initial encounter: Secondary | ICD-10-CM | POA: Diagnosis not present

## 2019-05-05 DIAGNOSIS — S32502D Unspecified fracture of left pubis, subsequent encounter for fracture with routine healing: Secondary | ICD-10-CM | POA: Diagnosis not present

## 2019-05-05 DIAGNOSIS — S22080A Wedge compression fracture of T11-T12 vertebra, initial encounter for closed fracture: Secondary | ICD-10-CM | POA: Diagnosis not present

## 2019-05-05 DIAGNOSIS — J9690 Respiratory failure, unspecified, unspecified whether with hypoxia or hypercapnia: Secondary | ICD-10-CM | POA: Diagnosis not present

## 2019-05-05 DIAGNOSIS — Z9181 History of falling: Secondary | ICD-10-CM | POA: Diagnosis not present

## 2019-05-05 DIAGNOSIS — K219 Gastro-esophageal reflux disease without esophagitis: Secondary | ICD-10-CM | POA: Diagnosis not present

## 2019-05-05 DIAGNOSIS — M47816 Spondylosis without myelopathy or radiculopathy, lumbar region: Secondary | ICD-10-CM | POA: Diagnosis not present

## 2019-05-05 DIAGNOSIS — S52125A Nondisplaced fracture of head of left radius, initial encounter for closed fracture: Secondary | ICD-10-CM | POA: Diagnosis not present

## 2019-05-05 DIAGNOSIS — I10 Essential (primary) hypertension: Secondary | ICD-10-CM | POA: Diagnosis not present

## 2019-05-05 DIAGNOSIS — J449 Chronic obstructive pulmonary disease, unspecified: Secondary | ICD-10-CM | POA: Diagnosis not present

## 2019-05-05 DIAGNOSIS — S3991XA Unspecified injury of abdomen, initial encounter: Secondary | ICD-10-CM | POA: Diagnosis not present

## 2019-05-05 DIAGNOSIS — Z45018 Encounter for adjustment and management of other part of cardiac pacemaker: Secondary | ICD-10-CM | POA: Diagnosis not present

## 2019-05-05 DIAGNOSIS — E559 Vitamin D deficiency, unspecified: Secondary | ICD-10-CM | POA: Diagnosis not present

## 2019-05-05 DIAGNOSIS — R52 Pain, unspecified: Secondary | ICD-10-CM | POA: Diagnosis not present

## 2019-05-05 DIAGNOSIS — Z4789 Encounter for other orthopedic aftercare: Secondary | ICD-10-CM | POA: Diagnosis not present

## 2019-05-05 DIAGNOSIS — W19XXXD Unspecified fall, subsequent encounter: Secondary | ICD-10-CM | POA: Diagnosis not present

## 2019-05-05 DIAGNOSIS — S32592D Other specified fracture of left pubis, subsequent encounter for fracture with routine healing: Secondary | ICD-10-CM | POA: Diagnosis not present

## 2019-05-05 DIAGNOSIS — S52122A Displaced fracture of head of left radius, initial encounter for closed fracture: Secondary | ICD-10-CM | POA: Diagnosis not present

## 2019-05-05 DIAGNOSIS — Z6829 Body mass index (BMI) 29.0-29.9, adult: Secondary | ICD-10-CM | POA: Diagnosis not present

## 2019-05-05 DIAGNOSIS — W010XXA Fall on same level from slipping, tripping and stumbling without subsequent striking against object, initial encounter: Secondary | ICD-10-CM | POA: Diagnosis not present

## 2019-05-05 DIAGNOSIS — S298XXA Other specified injuries of thorax, initial encounter: Secondary | ICD-10-CM | POA: Diagnosis not present

## 2019-05-05 DIAGNOSIS — M4856XA Collapsed vertebra, not elsewhere classified, lumbar region, initial encounter for fracture: Secondary | ICD-10-CM | POA: Diagnosis not present

## 2019-05-05 DIAGNOSIS — D696 Thrombocytopenia, unspecified: Secondary | ICD-10-CM | POA: Diagnosis not present

## 2019-05-05 DIAGNOSIS — M25552 Pain in left hip: Secondary | ICD-10-CM | POA: Diagnosis not present

## 2019-05-05 DIAGNOSIS — I517 Cardiomegaly: Secondary | ICD-10-CM | POA: Diagnosis not present

## 2019-05-05 DIAGNOSIS — Z79899 Other long term (current) drug therapy: Secondary | ICD-10-CM | POA: Diagnosis not present

## 2019-05-05 DIAGNOSIS — L743 Miliaria, unspecified: Secondary | ICD-10-CM | POA: Diagnosis not present

## 2019-05-05 DIAGNOSIS — G4733 Obstructive sleep apnea (adult) (pediatric): Secondary | ICD-10-CM | POA: Diagnosis not present

## 2019-05-05 DIAGNOSIS — I2510 Atherosclerotic heart disease of native coronary artery without angina pectoris: Secondary | ICD-10-CM | POA: Diagnosis not present

## 2019-05-05 DIAGNOSIS — R0902 Hypoxemia: Secondary | ICD-10-CM | POA: Diagnosis not present

## 2019-05-05 DIAGNOSIS — I495 Sick sinus syndrome: Secondary | ICD-10-CM | POA: Diagnosis not present

## 2019-05-05 DIAGNOSIS — S22088A Other fracture of T11-T12 vertebra, initial encounter for closed fracture: Secondary | ICD-10-CM | POA: Diagnosis not present

## 2019-05-05 DIAGNOSIS — Z8781 Personal history of (healed) traumatic fracture: Secondary | ICD-10-CM | POA: Diagnosis not present

## 2019-05-05 DIAGNOSIS — S52102D Unspecified fracture of upper end of left radius, subsequent encounter for closed fracture with routine healing: Secondary | ICD-10-CM | POA: Diagnosis not present

## 2019-05-05 DIAGNOSIS — I452 Bifascicular block: Secondary | ICD-10-CM | POA: Diagnosis not present

## 2019-05-05 DIAGNOSIS — S22088D Other fracture of T11-T12 vertebra, subsequent encounter for fracture with routine healing: Secondary | ICD-10-CM | POA: Diagnosis not present

## 2019-05-05 DIAGNOSIS — S32810A Multiple fractures of pelvis with stable disruption of pelvic ring, initial encounter for closed fracture: Secondary | ICD-10-CM | POA: Diagnosis not present

## 2019-05-05 DIAGNOSIS — Z87820 Personal history of traumatic brain injury: Secondary | ICD-10-CM | POA: Diagnosis not present

## 2019-05-05 DIAGNOSIS — I480 Paroxysmal atrial fibrillation: Secondary | ICD-10-CM | POA: Diagnosis not present

## 2019-05-05 DIAGNOSIS — S199XXA Unspecified injury of neck, initial encounter: Secondary | ICD-10-CM | POA: Diagnosis not present

## 2019-05-05 DIAGNOSIS — S32512A Fracture of superior rim of left pubis, initial encounter for closed fracture: Secondary | ICD-10-CM | POA: Diagnosis not present

## 2019-05-05 DIAGNOSIS — S7012XA Contusion of left thigh, initial encounter: Secondary | ICD-10-CM | POA: Diagnosis not present

## 2019-05-05 DIAGNOSIS — R000 Tachycardia, unspecified: Secondary | ICD-10-CM | POA: Diagnosis not present

## 2019-05-05 DIAGNOSIS — M84459A Pathological fracture, hip, unspecified, initial encounter for fracture: Secondary | ICD-10-CM | POA: Diagnosis not present

## 2019-05-05 DIAGNOSIS — F411 Generalized anxiety disorder: Secondary | ICD-10-CM | POA: Diagnosis not present

## 2019-05-05 DIAGNOSIS — F330 Major depressive disorder, recurrent, mild: Secondary | ICD-10-CM | POA: Diagnosis not present

## 2019-05-05 DIAGNOSIS — S32502A Unspecified fracture of left pubis, initial encounter for closed fracture: Secondary | ICD-10-CM | POA: Diagnosis not present

## 2019-05-05 DIAGNOSIS — S22089A Unspecified fracture of T11-T12 vertebra, initial encounter for closed fracture: Secondary | ICD-10-CM | POA: Diagnosis not present

## 2019-05-09 DIAGNOSIS — S32058D Other fracture of fifth lumbar vertebra, subsequent encounter for fracture with routine healing: Secondary | ICD-10-CM | POA: Diagnosis not present

## 2019-05-09 DIAGNOSIS — E46 Unspecified protein-calorie malnutrition: Secondary | ICD-10-CM | POA: Diagnosis not present

## 2019-05-09 DIAGNOSIS — I495 Sick sinus syndrome: Secondary | ICD-10-CM | POA: Diagnosis not present

## 2019-05-09 DIAGNOSIS — G4733 Obstructive sleep apnea (adult) (pediatric): Secondary | ICD-10-CM | POA: Diagnosis not present

## 2019-05-09 DIAGNOSIS — Z7982 Long term (current) use of aspirin: Secondary | ICD-10-CM | POA: Diagnosis not present

## 2019-05-09 DIAGNOSIS — M6281 Muscle weakness (generalized): Secondary | ICD-10-CM | POA: Diagnosis not present

## 2019-05-09 DIAGNOSIS — R262 Difficulty in walking, not elsewhere classified: Secondary | ICD-10-CM | POA: Diagnosis not present

## 2019-05-09 DIAGNOSIS — G8929 Other chronic pain: Secondary | ICD-10-CM | POA: Diagnosis not present

## 2019-05-09 DIAGNOSIS — S22088D Other fracture of T11-T12 vertebra, subsequent encounter for fracture with routine healing: Secondary | ICD-10-CM | POA: Diagnosis not present

## 2019-05-09 DIAGNOSIS — M1991 Primary osteoarthritis, unspecified site: Secondary | ICD-10-CM | POA: Diagnosis not present

## 2019-05-09 DIAGNOSIS — M810 Age-related osteoporosis without current pathological fracture: Secondary | ICD-10-CM | POA: Diagnosis not present

## 2019-05-09 DIAGNOSIS — S32028D Other fracture of second lumbar vertebra, subsequent encounter for fracture with routine healing: Secondary | ICD-10-CM | POA: Diagnosis not present

## 2019-05-09 DIAGNOSIS — M4186 Other forms of scoliosis, lumbar region: Secondary | ICD-10-CM | POA: Diagnosis not present

## 2019-05-09 DIAGNOSIS — K5909 Other constipation: Secondary | ICD-10-CM | POA: Diagnosis not present

## 2019-05-09 DIAGNOSIS — Z791 Long term (current) use of non-steroidal anti-inflammatories (NSAID): Secondary | ICD-10-CM | POA: Diagnosis not present

## 2019-05-09 DIAGNOSIS — F329 Major depressive disorder, single episode, unspecified: Secondary | ICD-10-CM | POA: Diagnosis not present

## 2019-05-09 DIAGNOSIS — R489 Unspecified symbolic dysfunctions: Secondary | ICD-10-CM | POA: Diagnosis not present

## 2019-05-09 DIAGNOSIS — I509 Heart failure, unspecified: Secondary | ICD-10-CM | POA: Diagnosis not present

## 2019-05-09 DIAGNOSIS — S52102D Unspecified fracture of upper end of left radius, subsequent encounter for closed fracture with routine healing: Secondary | ICD-10-CM | POA: Diagnosis not present

## 2019-05-09 DIAGNOSIS — K219 Gastro-esophageal reflux disease without esophagitis: Secondary | ICD-10-CM | POA: Diagnosis not present

## 2019-05-09 DIAGNOSIS — Z6832 Body mass index (BMI) 32.0-32.9, adult: Secondary | ICD-10-CM | POA: Diagnosis not present

## 2019-05-09 DIAGNOSIS — F330 Major depressive disorder, recurrent, mild: Secondary | ICD-10-CM | POA: Diagnosis not present

## 2019-05-09 DIAGNOSIS — J449 Chronic obstructive pulmonary disease, unspecified: Secondary | ICD-10-CM | POA: Diagnosis not present

## 2019-05-09 DIAGNOSIS — F419 Anxiety disorder, unspecified: Secondary | ICD-10-CM | POA: Diagnosis not present

## 2019-05-09 DIAGNOSIS — I4891 Unspecified atrial fibrillation: Secondary | ICD-10-CM | POA: Diagnosis not present

## 2019-05-09 DIAGNOSIS — Z4789 Encounter for other orthopedic aftercare: Secondary | ICD-10-CM | POA: Diagnosis not present

## 2019-05-09 DIAGNOSIS — S32038D Other fracture of third lumbar vertebra, subsequent encounter for fracture with routine healing: Secondary | ICD-10-CM | POA: Diagnosis not present

## 2019-05-09 DIAGNOSIS — J9610 Chronic respiratory failure, unspecified whether with hypoxia or hypercapnia: Secondary | ICD-10-CM | POA: Diagnosis not present

## 2019-05-09 DIAGNOSIS — I480 Paroxysmal atrial fibrillation: Secondary | ICD-10-CM | POA: Diagnosis not present

## 2019-05-09 DIAGNOSIS — Z87820 Personal history of traumatic brain injury: Secondary | ICD-10-CM | POA: Diagnosis not present

## 2019-05-09 DIAGNOSIS — M2550 Pain in unspecified joint: Secondary | ICD-10-CM | POA: Diagnosis not present

## 2019-05-09 DIAGNOSIS — I2510 Atherosclerotic heart disease of native coronary artery without angina pectoris: Secondary | ICD-10-CM | POA: Diagnosis not present

## 2019-05-09 DIAGNOSIS — D696 Thrombocytopenia, unspecified: Secondary | ICD-10-CM | POA: Diagnosis not present

## 2019-05-09 DIAGNOSIS — M4134 Thoracogenic scoliosis, thoracic region: Secondary | ICD-10-CM | POA: Diagnosis not present

## 2019-05-09 DIAGNOSIS — R5381 Other malaise: Secondary | ICD-10-CM | POA: Diagnosis not present

## 2019-05-09 DIAGNOSIS — S22080D Wedge compression fracture of T11-T12 vertebra, subsequent encounter for fracture with routine healing: Secondary | ICD-10-CM | POA: Diagnosis not present

## 2019-05-09 DIAGNOSIS — S32592D Other specified fracture of left pubis, subsequent encounter for fracture with routine healing: Secondary | ICD-10-CM | POA: Diagnosis not present

## 2019-05-09 DIAGNOSIS — L743 Miliaria, unspecified: Secondary | ICD-10-CM | POA: Diagnosis not present

## 2019-05-09 DIAGNOSIS — Z20828 Contact with and (suspected) exposure to other viral communicable diseases: Secondary | ICD-10-CM | POA: Diagnosis not present

## 2019-05-09 DIAGNOSIS — S52301D Unspecified fracture of shaft of right radius, subsequent encounter for closed fracture with routine healing: Secondary | ICD-10-CM | POA: Diagnosis not present

## 2019-05-09 DIAGNOSIS — S0231XD Fracture of orbital floor, right side, subsequent encounter for fracture with routine healing: Secondary | ICD-10-CM | POA: Diagnosis not present

## 2019-05-09 DIAGNOSIS — S32810D Multiple fractures of pelvis with stable disruption of pelvic ring, subsequent encounter for fracture with routine healing: Secondary | ICD-10-CM | POA: Diagnosis not present

## 2019-05-09 DIAGNOSIS — M84459A Pathological fracture, hip, unspecified, initial encounter for fracture: Secondary | ICD-10-CM | POA: Diagnosis not present

## 2019-05-09 DIAGNOSIS — R269 Unspecified abnormalities of gait and mobility: Secondary | ICD-10-CM | POA: Diagnosis not present

## 2019-05-09 DIAGNOSIS — J9690 Respiratory failure, unspecified, unspecified whether with hypoxia or hypercapnia: Secondary | ICD-10-CM | POA: Diagnosis not present

## 2019-05-09 DIAGNOSIS — F411 Generalized anxiety disorder: Secondary | ICD-10-CM | POA: Diagnosis not present

## 2019-05-09 DIAGNOSIS — S32502D Unspecified fracture of left pubis, subsequent encounter for fracture with routine healing: Secondary | ICD-10-CM | POA: Diagnosis not present

## 2019-05-09 DIAGNOSIS — E872 Acidosis: Secondary | ICD-10-CM | POA: Diagnosis not present

## 2019-05-09 DIAGNOSIS — S52699D Other fracture of lower end of unspecified ulna, subsequent encounter for closed fracture with routine healing: Secondary | ICD-10-CM | POA: Diagnosis not present

## 2019-05-09 DIAGNOSIS — S32000D Wedge compression fracture of unspecified lumbar vertebra, subsequent encounter for fracture with routine healing: Secondary | ICD-10-CM | POA: Diagnosis not present

## 2019-05-09 DIAGNOSIS — W19XXXD Unspecified fall, subsequent encounter: Secondary | ICD-10-CM | POA: Diagnosis not present

## 2019-05-09 DIAGNOSIS — I471 Supraventricular tachycardia: Secondary | ICD-10-CM | POA: Diagnosis not present

## 2019-05-10 DIAGNOSIS — S32810D Multiple fractures of pelvis with stable disruption of pelvic ring, subsequent encounter for fracture with routine healing: Secondary | ICD-10-CM | POA: Diagnosis not present

## 2019-05-10 DIAGNOSIS — M2550 Pain in unspecified joint: Secondary | ICD-10-CM | POA: Diagnosis not present

## 2019-05-10 DIAGNOSIS — M810 Age-related osteoporosis without current pathological fracture: Secondary | ICD-10-CM | POA: Diagnosis not present

## 2019-05-10 DIAGNOSIS — R262 Difficulty in walking, not elsewhere classified: Secondary | ICD-10-CM | POA: Diagnosis not present

## 2019-05-22 DIAGNOSIS — S32028D Other fracture of second lumbar vertebra, subsequent encounter for fracture with routine healing: Secondary | ICD-10-CM | POA: Diagnosis not present

## 2019-05-22 DIAGNOSIS — S32038D Other fracture of third lumbar vertebra, subsequent encounter for fracture with routine healing: Secondary | ICD-10-CM | POA: Diagnosis not present

## 2019-05-22 DIAGNOSIS — S32058D Other fracture of fifth lumbar vertebra, subsequent encounter for fracture with routine healing: Secondary | ICD-10-CM | POA: Diagnosis not present

## 2019-05-22 DIAGNOSIS — S22080D Wedge compression fracture of T11-T12 vertebra, subsequent encounter for fracture with routine healing: Secondary | ICD-10-CM | POA: Diagnosis not present

## 2019-05-22 DIAGNOSIS — M4134 Thoracogenic scoliosis, thoracic region: Secondary | ICD-10-CM | POA: Diagnosis not present

## 2019-05-22 DIAGNOSIS — M4186 Other forms of scoliosis, lumbar region: Secondary | ICD-10-CM | POA: Diagnosis not present

## 2019-05-22 DIAGNOSIS — S32000D Wedge compression fracture of unspecified lumbar vertebra, subsequent encounter for fracture with routine healing: Secondary | ICD-10-CM | POA: Diagnosis not present

## 2019-05-31 DIAGNOSIS — F330 Major depressive disorder, recurrent, mild: Secondary | ICD-10-CM | POA: Diagnosis not present

## 2019-05-31 DIAGNOSIS — Z6832 Body mass index (BMI) 32.0-32.9, adult: Secondary | ICD-10-CM | POA: Diagnosis not present

## 2019-05-31 DIAGNOSIS — M810 Age-related osteoporosis without current pathological fracture: Secondary | ICD-10-CM | POA: Diagnosis not present

## 2019-05-31 DIAGNOSIS — K219 Gastro-esophageal reflux disease without esophagitis: Secondary | ICD-10-CM | POA: Diagnosis not present

## 2019-05-31 DIAGNOSIS — Z791 Long term (current) use of non-steroidal anti-inflammatories (NSAID): Secondary | ICD-10-CM | POA: Diagnosis not present

## 2019-05-31 DIAGNOSIS — I4891 Unspecified atrial fibrillation: Secondary | ICD-10-CM | POA: Diagnosis not present

## 2019-05-31 DIAGNOSIS — E872 Acidosis: Secondary | ICD-10-CM | POA: Diagnosis not present

## 2019-05-31 DIAGNOSIS — J449 Chronic obstructive pulmonary disease, unspecified: Secondary | ICD-10-CM | POA: Diagnosis not present

## 2019-05-31 DIAGNOSIS — F329 Major depressive disorder, single episode, unspecified: Secondary | ICD-10-CM | POA: Diagnosis not present

## 2019-05-31 DIAGNOSIS — S52301D Unspecified fracture of shaft of right radius, subsequent encounter for closed fracture with routine healing: Secondary | ICD-10-CM | POA: Diagnosis not present

## 2019-05-31 DIAGNOSIS — Z87820 Personal history of traumatic brain injury: Secondary | ICD-10-CM | POA: Diagnosis not present

## 2019-05-31 DIAGNOSIS — M2550 Pain in unspecified joint: Secondary | ICD-10-CM | POA: Diagnosis not present

## 2019-05-31 DIAGNOSIS — F411 Generalized anxiety disorder: Secondary | ICD-10-CM | POA: Diagnosis not present

## 2019-05-31 DIAGNOSIS — S52102D Unspecified fracture of upper end of left radius, subsequent encounter for closed fracture with routine healing: Secondary | ICD-10-CM | POA: Diagnosis not present

## 2019-05-31 DIAGNOSIS — I495 Sick sinus syndrome: Secondary | ICD-10-CM | POA: Diagnosis not present

## 2019-05-31 DIAGNOSIS — I471 Supraventricular tachycardia: Secondary | ICD-10-CM | POA: Diagnosis not present

## 2019-05-31 DIAGNOSIS — S32502D Unspecified fracture of left pubis, subsequent encounter for fracture with routine healing: Secondary | ICD-10-CM | POA: Diagnosis not present

## 2019-05-31 DIAGNOSIS — I480 Paroxysmal atrial fibrillation: Secondary | ICD-10-CM | POA: Diagnosis not present

## 2019-05-31 DIAGNOSIS — S22088D Other fracture of T11-T12 vertebra, subsequent encounter for fracture with routine healing: Secondary | ICD-10-CM | POA: Diagnosis not present

## 2019-05-31 DIAGNOSIS — R5381 Other malaise: Secondary | ICD-10-CM | POA: Diagnosis not present

## 2019-05-31 DIAGNOSIS — S52699D Other fracture of lower end of unspecified ulna, subsequent encounter for closed fracture with routine healing: Secondary | ICD-10-CM | POA: Diagnosis not present

## 2019-05-31 DIAGNOSIS — M6281 Muscle weakness (generalized): Secondary | ICD-10-CM | POA: Diagnosis not present

## 2019-05-31 DIAGNOSIS — Z7982 Long term (current) use of aspirin: Secondary | ICD-10-CM | POA: Diagnosis not present

## 2019-05-31 DIAGNOSIS — G8929 Other chronic pain: Secondary | ICD-10-CM | POA: Diagnosis not present

## 2019-05-31 DIAGNOSIS — K5909 Other constipation: Secondary | ICD-10-CM | POA: Diagnosis not present

## 2019-05-31 DIAGNOSIS — R489 Unspecified symbolic dysfunctions: Secondary | ICD-10-CM | POA: Diagnosis not present

## 2019-05-31 DIAGNOSIS — S0231XD Fracture of orbital floor, right side, subsequent encounter for fracture with routine healing: Secondary | ICD-10-CM | POA: Diagnosis not present

## 2019-05-31 DIAGNOSIS — I2510 Atherosclerotic heart disease of native coronary artery without angina pectoris: Secondary | ICD-10-CM | POA: Diagnosis not present

## 2019-05-31 DIAGNOSIS — I509 Heart failure, unspecified: Secondary | ICD-10-CM | POA: Diagnosis not present

## 2019-05-31 DIAGNOSIS — M1991 Primary osteoarthritis, unspecified site: Secondary | ICD-10-CM | POA: Diagnosis not present

## 2019-05-31 DIAGNOSIS — S32592D Other specified fracture of left pubis, subsequent encounter for fracture with routine healing: Secondary | ICD-10-CM | POA: Diagnosis not present

## 2019-05-31 DIAGNOSIS — G4733 Obstructive sleep apnea (adult) (pediatric): Secondary | ICD-10-CM | POA: Diagnosis not present

## 2019-05-31 DIAGNOSIS — R269 Unspecified abnormalities of gait and mobility: Secondary | ICD-10-CM | POA: Diagnosis not present

## 2019-05-31 DIAGNOSIS — F419 Anxiety disorder, unspecified: Secondary | ICD-10-CM | POA: Diagnosis not present

## 2019-05-31 DIAGNOSIS — E46 Unspecified protein-calorie malnutrition: Secondary | ICD-10-CM | POA: Diagnosis not present

## 2019-05-31 DIAGNOSIS — Z4789 Encounter for other orthopedic aftercare: Secondary | ICD-10-CM | POA: Diagnosis not present

## 2019-06-02 DIAGNOSIS — Z4789 Encounter for other orthopedic aftercare: Secondary | ICD-10-CM | POA: Diagnosis not present

## 2019-06-18 DIAGNOSIS — S52125D Nondisplaced fracture of head of left radius, subsequent encounter for closed fracture with routine healing: Secondary | ICD-10-CM | POA: Diagnosis not present

## 2019-06-18 DIAGNOSIS — Z4789 Encounter for other orthopedic aftercare: Secondary | ICD-10-CM | POA: Diagnosis not present

## 2019-06-18 DIAGNOSIS — R102 Pelvic and perineal pain: Secondary | ICD-10-CM | POA: Diagnosis not present

## 2019-06-18 DIAGNOSIS — S32810D Multiple fractures of pelvis with stable disruption of pelvic ring, subsequent encounter for fracture with routine healing: Secondary | ICD-10-CM | POA: Diagnosis not present

## 2019-06-18 DIAGNOSIS — M545 Low back pain: Secondary | ICD-10-CM | POA: Diagnosis not present

## 2019-06-18 DIAGNOSIS — M810 Age-related osteoporosis without current pathological fracture: Secondary | ICD-10-CM | POA: Diagnosis not present

## 2019-06-18 DIAGNOSIS — S3289XD Fracture of other parts of pelvis, subsequent encounter for fracture with routine healing: Secondary | ICD-10-CM | POA: Diagnosis not present

## 2019-06-23 DIAGNOSIS — M549 Dorsalgia, unspecified: Secondary | ICD-10-CM | POA: Diagnosis not present

## 2019-06-23 DIAGNOSIS — J449 Chronic obstructive pulmonary disease, unspecified: Secondary | ICD-10-CM | POA: Diagnosis not present

## 2019-06-23 DIAGNOSIS — M8000XA Age-related osteoporosis with current pathological fracture, unspecified site, initial encounter for fracture: Secondary | ICD-10-CM | POA: Diagnosis not present

## 2019-06-23 DIAGNOSIS — R143 Flatulence: Secondary | ICD-10-CM | POA: Diagnosis not present

## 2019-06-23 DIAGNOSIS — K5900 Constipation, unspecified: Secondary | ICD-10-CM | POA: Diagnosis not present

## 2019-06-26 DIAGNOSIS — S22088D Other fracture of T11-T12 vertebra, subsequent encounter for fracture with routine healing: Secondary | ICD-10-CM | POA: Diagnosis not present

## 2019-06-26 DIAGNOSIS — S22089D Unspecified fracture of T11-T12 vertebra, subsequent encounter for fracture with routine healing: Secondary | ICD-10-CM | POA: Diagnosis not present

## 2019-06-26 DIAGNOSIS — S32028D Other fracture of second lumbar vertebra, subsequent encounter for fracture with routine healing: Secondary | ICD-10-CM | POA: Diagnosis not present

## 2019-06-26 DIAGNOSIS — S32038D Other fracture of third lumbar vertebra, subsequent encounter for fracture with routine healing: Secondary | ICD-10-CM | POA: Diagnosis not present

## 2019-06-26 DIAGNOSIS — S22000S Wedge compression fracture of unspecified thoracic vertebra, sequela: Secondary | ICD-10-CM | POA: Diagnosis not present

## 2019-06-30 DIAGNOSIS — J449 Chronic obstructive pulmonary disease, unspecified: Secondary | ICD-10-CM | POA: Diagnosis not present

## 2019-06-30 DIAGNOSIS — I471 Supraventricular tachycardia: Secondary | ICD-10-CM | POA: Diagnosis not present

## 2019-06-30 DIAGNOSIS — S32502D Unspecified fracture of left pubis, subsequent encounter for fracture with routine healing: Secondary | ICD-10-CM | POA: Diagnosis not present

## 2019-06-30 DIAGNOSIS — G8929 Other chronic pain: Secondary | ICD-10-CM | POA: Diagnosis not present

## 2019-06-30 DIAGNOSIS — F411 Generalized anxiety disorder: Secondary | ICD-10-CM | POA: Diagnosis not present

## 2019-06-30 DIAGNOSIS — E46 Unspecified protein-calorie malnutrition: Secondary | ICD-10-CM | POA: Diagnosis not present

## 2019-06-30 DIAGNOSIS — F330 Major depressive disorder, recurrent, mild: Secondary | ICD-10-CM | POA: Diagnosis not present

## 2019-06-30 DIAGNOSIS — M810 Age-related osteoporosis without current pathological fracture: Secondary | ICD-10-CM | POA: Diagnosis not present

## 2019-06-30 DIAGNOSIS — Z7982 Long term (current) use of aspirin: Secondary | ICD-10-CM | POA: Diagnosis not present

## 2019-06-30 DIAGNOSIS — G4733 Obstructive sleep apnea (adult) (pediatric): Secondary | ICD-10-CM | POA: Diagnosis not present

## 2019-06-30 DIAGNOSIS — K5909 Other constipation: Secondary | ICD-10-CM | POA: Diagnosis not present

## 2019-06-30 DIAGNOSIS — S52102D Unspecified fracture of upper end of left radius, subsequent encounter for closed fracture with routine healing: Secondary | ICD-10-CM | POA: Diagnosis not present

## 2019-06-30 DIAGNOSIS — M2550 Pain in unspecified joint: Secondary | ICD-10-CM | POA: Diagnosis not present

## 2019-06-30 DIAGNOSIS — Z4789 Encounter for other orthopedic aftercare: Secondary | ICD-10-CM | POA: Diagnosis not present

## 2019-06-30 DIAGNOSIS — I480 Paroxysmal atrial fibrillation: Secondary | ICD-10-CM | POA: Diagnosis not present

## 2019-06-30 DIAGNOSIS — R5381 Other malaise: Secondary | ICD-10-CM | POA: Diagnosis not present

## 2019-06-30 DIAGNOSIS — R269 Unspecified abnormalities of gait and mobility: Secondary | ICD-10-CM | POA: Diagnosis not present

## 2019-06-30 DIAGNOSIS — M1991 Primary osteoarthritis, unspecified site: Secondary | ICD-10-CM | POA: Diagnosis not present

## 2019-06-30 DIAGNOSIS — S32592D Other specified fracture of left pubis, subsequent encounter for fracture with routine healing: Secondary | ICD-10-CM | POA: Diagnosis not present

## 2019-06-30 DIAGNOSIS — S22088D Other fracture of T11-T12 vertebra, subsequent encounter for fracture with routine healing: Secondary | ICD-10-CM | POA: Diagnosis not present

## 2019-06-30 DIAGNOSIS — I495 Sick sinus syndrome: Secondary | ICD-10-CM | POA: Diagnosis not present

## 2019-06-30 DIAGNOSIS — I2510 Atherosclerotic heart disease of native coronary artery without angina pectoris: Secondary | ICD-10-CM | POA: Diagnosis not present

## 2019-06-30 DIAGNOSIS — K219 Gastro-esophageal reflux disease without esophagitis: Secondary | ICD-10-CM | POA: Diagnosis not present

## 2019-06-30 DIAGNOSIS — E872 Acidosis: Secondary | ICD-10-CM | POA: Diagnosis not present

## 2019-07-01 DIAGNOSIS — G4733 Obstructive sleep apnea (adult) (pediatric): Secondary | ICD-10-CM | POA: Diagnosis not present

## 2019-07-18 DIAGNOSIS — I480 Paroxysmal atrial fibrillation: Secondary | ICD-10-CM | POA: Diagnosis not present

## 2019-07-18 DIAGNOSIS — Z45018 Encounter for adjustment and management of other part of cardiac pacemaker: Secondary | ICD-10-CM | POA: Diagnosis not present

## 2019-07-18 DIAGNOSIS — G4733 Obstructive sleep apnea (adult) (pediatric): Secondary | ICD-10-CM | POA: Diagnosis not present

## 2019-07-18 DIAGNOSIS — J449 Chronic obstructive pulmonary disease, unspecified: Secondary | ICD-10-CM | POA: Diagnosis not present

## 2019-07-18 DIAGNOSIS — I471 Supraventricular tachycardia: Secondary | ICD-10-CM | POA: Diagnosis not present

## 2019-07-30 DIAGNOSIS — M47817 Spondylosis without myelopathy or radiculopathy, lumbosacral region: Secondary | ICD-10-CM | POA: Diagnosis not present

## 2019-07-30 DIAGNOSIS — M8588 Other specified disorders of bone density and structure, other site: Secondary | ICD-10-CM | POA: Diagnosis not present

## 2019-07-30 DIAGNOSIS — S32810D Multiple fractures of pelvis with stable disruption of pelvic ring, subsequent encounter for fracture with routine healing: Secondary | ICD-10-CM | POA: Diagnosis not present

## 2019-07-30 DIAGNOSIS — M160 Bilateral primary osteoarthritis of hip: Secondary | ICD-10-CM | POA: Diagnosis not present

## 2019-07-30 DIAGNOSIS — S52125D Nondisplaced fracture of head of left radius, subsequent encounter for closed fracture with routine healing: Secondary | ICD-10-CM | POA: Diagnosis not present

## 2019-07-30 DIAGNOSIS — Z4789 Encounter for other orthopedic aftercare: Secondary | ICD-10-CM | POA: Diagnosis not present

## 2019-07-30 DIAGNOSIS — M461 Sacroiliitis, not elsewhere classified: Secondary | ICD-10-CM | POA: Diagnosis not present

## 2019-07-31 DIAGNOSIS — Z950 Presence of cardiac pacemaker: Secondary | ICD-10-CM | POA: Diagnosis not present

## 2019-08-28 DIAGNOSIS — L300 Nummular dermatitis: Secondary | ICD-10-CM | POA: Diagnosis not present

## 2019-08-28 DIAGNOSIS — L821 Other seborrheic keratosis: Secondary | ICD-10-CM | POA: Diagnosis not present

## 2019-08-28 DIAGNOSIS — B351 Tinea unguium: Secondary | ICD-10-CM | POA: Diagnosis not present

## 2019-08-28 DIAGNOSIS — B353 Tinea pedis: Secondary | ICD-10-CM | POA: Diagnosis not present

## 2019-09-03 DIAGNOSIS — Z961 Presence of intraocular lens: Secondary | ICD-10-CM | POA: Diagnosis not present

## 2019-09-03 DIAGNOSIS — H02883 Meibomian gland dysfunction of right eye, unspecified eyelid: Secondary | ICD-10-CM | POA: Diagnosis not present

## 2019-09-03 DIAGNOSIS — H01009 Unspecified blepharitis unspecified eye, unspecified eyelid: Secondary | ICD-10-CM | POA: Diagnosis not present

## 2019-09-03 DIAGNOSIS — H04129 Dry eye syndrome of unspecified lacrimal gland: Secondary | ICD-10-CM | POA: Diagnosis not present

## 2019-09-05 DIAGNOSIS — J449 Chronic obstructive pulmonary disease, unspecified: Secondary | ICD-10-CM | POA: Diagnosis not present

## 2019-09-05 DIAGNOSIS — Z13820 Encounter for screening for osteoporosis: Secondary | ICD-10-CM | POA: Diagnosis not present

## 2019-09-05 DIAGNOSIS — I480 Paroxysmal atrial fibrillation: Secondary | ICD-10-CM | POA: Diagnosis not present

## 2019-09-05 DIAGNOSIS — E559 Vitamin D deficiency, unspecified: Secondary | ICD-10-CM | POA: Diagnosis not present

## 2019-09-05 DIAGNOSIS — Z9181 History of falling: Secondary | ICD-10-CM | POA: Diagnosis not present

## 2019-09-05 DIAGNOSIS — R319 Hematuria, unspecified: Secondary | ICD-10-CM | POA: Diagnosis not present

## 2019-09-05 DIAGNOSIS — N390 Urinary tract infection, site not specified: Secondary | ICD-10-CM | POA: Diagnosis not present

## 2019-09-05 DIAGNOSIS — Z8781 Personal history of (healed) traumatic fracture: Secondary | ICD-10-CM | POA: Diagnosis not present

## 2019-09-05 DIAGNOSIS — M810 Age-related osteoporosis without current pathological fracture: Secondary | ICD-10-CM | POA: Diagnosis not present

## 2019-09-12 DIAGNOSIS — J449 Chronic obstructive pulmonary disease, unspecified: Secondary | ICD-10-CM | POA: Diagnosis not present

## 2019-09-12 DIAGNOSIS — Z8781 Personal history of (healed) traumatic fracture: Secondary | ICD-10-CM | POA: Diagnosis not present

## 2019-09-12 DIAGNOSIS — I480 Paroxysmal atrial fibrillation: Secondary | ICD-10-CM | POA: Diagnosis not present

## 2019-09-12 DIAGNOSIS — M810 Age-related osteoporosis without current pathological fracture: Secondary | ICD-10-CM | POA: Diagnosis not present

## 2019-09-25 DIAGNOSIS — B353 Tinea pedis: Secondary | ICD-10-CM | POA: Diagnosis not present

## 2019-09-25 DIAGNOSIS — B351 Tinea unguium: Secondary | ICD-10-CM | POA: Diagnosis not present

## 2019-09-25 DIAGNOSIS — D485 Neoplasm of uncertain behavior of skin: Secondary | ICD-10-CM | POA: Diagnosis not present

## 2019-10-07 DIAGNOSIS — C44619 Basal cell carcinoma of skin of left upper limb, including shoulder: Secondary | ICD-10-CM | POA: Diagnosis not present

## 2019-11-13 DIAGNOSIS — Z45018 Encounter for adjustment and management of other part of cardiac pacemaker: Secondary | ICD-10-CM | POA: Diagnosis not present

## 2019-12-04 DIAGNOSIS — M6530 Trigger finger, unspecified finger: Secondary | ICD-10-CM | POA: Diagnosis not present

## 2019-12-18 DIAGNOSIS — R519 Headache, unspecified: Secondary | ICD-10-CM | POA: Diagnosis not present

## 2019-12-18 DIAGNOSIS — R413 Other amnesia: Secondary | ICD-10-CM | POA: Diagnosis not present

## 2019-12-18 DIAGNOSIS — R6889 Other general symptoms and signs: Secondary | ICD-10-CM | POA: Diagnosis not present

## 2019-12-25 DIAGNOSIS — N3091 Cystitis, unspecified with hematuria: Secondary | ICD-10-CM | POA: Diagnosis not present

## 2019-12-25 DIAGNOSIS — R300 Dysuria: Secondary | ICD-10-CM | POA: Diagnosis not present

## 2019-12-30 DIAGNOSIS — G4733 Obstructive sleep apnea (adult) (pediatric): Secondary | ICD-10-CM | POA: Diagnosis not present

## 2020-01-13 DIAGNOSIS — H93A9 Pulsatile tinnitus, unspecified ear: Secondary | ICD-10-CM | POA: Diagnosis not present

## 2020-01-13 DIAGNOSIS — N390 Urinary tract infection, site not specified: Secondary | ICD-10-CM | POA: Diagnosis not present

## 2020-01-14 DIAGNOSIS — F419 Anxiety disorder, unspecified: Secondary | ICD-10-CM | POA: Diagnosis not present

## 2020-01-14 DIAGNOSIS — J309 Allergic rhinitis, unspecified: Secondary | ICD-10-CM | POA: Diagnosis not present

## 2020-01-15 DIAGNOSIS — H93A9 Pulsatile tinnitus, unspecified ear: Secondary | ICD-10-CM | POA: Diagnosis not present

## 2020-01-15 DIAGNOSIS — I6523 Occlusion and stenosis of bilateral carotid arteries: Secondary | ICD-10-CM | POA: Diagnosis not present

## 2020-02-03 DIAGNOSIS — Z79899 Other long term (current) drug therapy: Secondary | ICD-10-CM | POA: Diagnosis not present

## 2020-02-03 DIAGNOSIS — Z1331 Encounter for screening for depression: Secondary | ICD-10-CM | POA: Diagnosis not present

## 2020-02-03 DIAGNOSIS — R102 Pelvic and perineal pain: Secondary | ICD-10-CM | POA: Diagnosis not present

## 2020-02-03 DIAGNOSIS — Z1339 Encounter for screening examination for other mental health and behavioral disorders: Secondary | ICD-10-CM | POA: Diagnosis not present

## 2020-02-03 DIAGNOSIS — Z0000 Encounter for general adult medical examination without abnormal findings: Secondary | ICD-10-CM | POA: Diagnosis not present

## 2020-02-03 DIAGNOSIS — R519 Headache, unspecified: Secondary | ICD-10-CM | POA: Diagnosis not present

## 2020-02-03 DIAGNOSIS — Z6832 Body mass index (BMI) 32.0-32.9, adult: Secondary | ICD-10-CM | POA: Diagnosis not present

## 2020-02-03 DIAGNOSIS — M5432 Sciatica, left side: Secondary | ICD-10-CM | POA: Diagnosis not present

## 2020-02-03 DIAGNOSIS — R3915 Urgency of urination: Secondary | ICD-10-CM | POA: Diagnosis not present

## 2020-02-03 DIAGNOSIS — D696 Thrombocytopenia, unspecified: Secondary | ICD-10-CM | POA: Diagnosis not present

## 2020-02-03 DIAGNOSIS — E785 Hyperlipidemia, unspecified: Secondary | ICD-10-CM | POA: Diagnosis not present

## 2020-02-04 DIAGNOSIS — N390 Urinary tract infection, site not specified: Secondary | ICD-10-CM | POA: Diagnosis not present

## 2020-02-25 DIAGNOSIS — D696 Thrombocytopenia, unspecified: Secondary | ICD-10-CM | POA: Diagnosis not present

## 2020-03-03 DIAGNOSIS — L570 Actinic keratosis: Secondary | ICD-10-CM | POA: Diagnosis not present

## 2020-03-03 DIAGNOSIS — L300 Nummular dermatitis: Secondary | ICD-10-CM | POA: Diagnosis not present

## 2020-03-05 DIAGNOSIS — Z1231 Encounter for screening mammogram for malignant neoplasm of breast: Secondary | ICD-10-CM | POA: Diagnosis not present

## 2020-03-31 DIAGNOSIS — R3989 Other symptoms and signs involving the genitourinary system: Secondary | ICD-10-CM | POA: Diagnosis not present

## 2020-04-05 DIAGNOSIS — R051 Acute cough: Secondary | ICD-10-CM | POA: Diagnosis not present

## 2020-04-05 DIAGNOSIS — J189 Pneumonia, unspecified organism: Secondary | ICD-10-CM | POA: Diagnosis not present

## 2020-04-05 DIAGNOSIS — I509 Heart failure, unspecified: Secondary | ICD-10-CM | POA: Diagnosis not present

## 2020-04-05 DIAGNOSIS — R0981 Nasal congestion: Secondary | ICD-10-CM | POA: Diagnosis not present

## 2020-04-05 DIAGNOSIS — Z20828 Contact with and (suspected) exposure to other viral communicable diseases: Secondary | ICD-10-CM | POA: Diagnosis not present

## 2020-04-06 DIAGNOSIS — U071 COVID-19: Secondary | ICD-10-CM | POA: Diagnosis not present

## 2020-04-14 DIAGNOSIS — R0602 Shortness of breath: Secondary | ICD-10-CM | POA: Diagnosis not present

## 2020-04-14 DIAGNOSIS — U071 COVID-19: Secondary | ICD-10-CM | POA: Diagnosis not present

## 2020-04-14 DIAGNOSIS — J189 Pneumonia, unspecified organism: Secondary | ICD-10-CM | POA: Diagnosis not present

## 2020-04-14 DIAGNOSIS — R051 Acute cough: Secondary | ICD-10-CM | POA: Diagnosis not present

## 2020-04-16 DIAGNOSIS — Z03818 Encounter for observation for suspected exposure to other biological agents ruled out: Secondary | ICD-10-CM | POA: Diagnosis not present

## 2020-06-29 DIAGNOSIS — I480 Paroxysmal atrial fibrillation: Secondary | ICD-10-CM | POA: Diagnosis not present

## 2020-06-29 DIAGNOSIS — M1611 Unilateral primary osteoarthritis, right hip: Secondary | ICD-10-CM | POA: Diagnosis not present

## 2020-06-29 DIAGNOSIS — F3342 Major depressive disorder, recurrent, in full remission: Secondary | ICD-10-CM | POA: Diagnosis not present

## 2020-06-29 DIAGNOSIS — I495 Sick sinus syndrome: Secondary | ICD-10-CM | POA: Diagnosis not present

## 2020-06-29 DIAGNOSIS — D692 Other nonthrombocytopenic purpura: Secondary | ICD-10-CM | POA: Diagnosis not present

## 2020-06-29 DIAGNOSIS — Z515 Encounter for palliative care: Secondary | ICD-10-CM | POA: Diagnosis not present

## 2020-06-29 DIAGNOSIS — G4733 Obstructive sleep apnea (adult) (pediatric): Secondary | ICD-10-CM | POA: Diagnosis not present

## 2020-06-29 DIAGNOSIS — I509 Heart failure, unspecified: Secondary | ICD-10-CM | POA: Diagnosis not present

## 2020-06-29 DIAGNOSIS — E261 Secondary hyperaldosteronism: Secondary | ICD-10-CM | POA: Diagnosis not present

## 2020-06-29 DIAGNOSIS — J449 Chronic obstructive pulmonary disease, unspecified: Secondary | ICD-10-CM | POA: Diagnosis not present

## 2020-06-29 DIAGNOSIS — D6869 Other thrombophilia: Secondary | ICD-10-CM | POA: Diagnosis not present

## 2020-06-29 DIAGNOSIS — I700 Atherosclerosis of aorta: Secondary | ICD-10-CM | POA: Diagnosis not present

## 2020-07-01 DIAGNOSIS — G4733 Obstructive sleep apnea (adult) (pediatric): Secondary | ICD-10-CM | POA: Diagnosis not present

## 2020-07-27 DIAGNOSIS — H527 Unspecified disorder of refraction: Secondary | ICD-10-CM | POA: Diagnosis not present

## 2020-08-18 DIAGNOSIS — I495 Sick sinus syndrome: Secondary | ICD-10-CM | POA: Diagnosis not present

## 2020-08-18 DIAGNOSIS — I480 Paroxysmal atrial fibrillation: Secondary | ICD-10-CM | POA: Diagnosis not present

## 2020-08-18 DIAGNOSIS — G4733 Obstructive sleep apnea (adult) (pediatric): Secondary | ICD-10-CM | POA: Diagnosis not present

## 2020-08-18 DIAGNOSIS — Z45018 Encounter for adjustment and management of other part of cardiac pacemaker: Secondary | ICD-10-CM | POA: Diagnosis not present

## 2020-08-18 DIAGNOSIS — I358 Other nonrheumatic aortic valve disorders: Secondary | ICD-10-CM | POA: Diagnosis not present

## 2020-08-18 DIAGNOSIS — I471 Supraventricular tachycardia: Secondary | ICD-10-CM | POA: Diagnosis not present

## 2020-08-18 DIAGNOSIS — J449 Chronic obstructive pulmonary disease, unspecified: Secondary | ICD-10-CM | POA: Diagnosis not present

## 2020-09-09 DIAGNOSIS — I358 Other nonrheumatic aortic valve disorders: Secondary | ICD-10-CM | POA: Diagnosis not present

## 2020-09-18 DIAGNOSIS — I480 Paroxysmal atrial fibrillation: Secondary | ICD-10-CM | POA: Diagnosis not present

## 2020-09-18 DIAGNOSIS — E785 Hyperlipidemia, unspecified: Secondary | ICD-10-CM | POA: Diagnosis not present

## 2020-09-18 DIAGNOSIS — J449 Chronic obstructive pulmonary disease, unspecified: Secondary | ICD-10-CM | POA: Diagnosis not present

## 2020-09-18 DIAGNOSIS — I2510 Atherosclerotic heart disease of native coronary artery without angina pectoris: Secondary | ICD-10-CM | POA: Diagnosis not present

## 2020-10-28 DIAGNOSIS — I509 Heart failure, unspecified: Secondary | ICD-10-CM | POA: Diagnosis not present

## 2020-10-28 DIAGNOSIS — I480 Paroxysmal atrial fibrillation: Secondary | ICD-10-CM | POA: Diagnosis not present

## 2020-10-28 DIAGNOSIS — E261 Secondary hyperaldosteronism: Secondary | ICD-10-CM | POA: Diagnosis not present

## 2020-10-28 DIAGNOSIS — Z950 Presence of cardiac pacemaker: Secondary | ICD-10-CM | POA: Diagnosis not present

## 2020-10-28 DIAGNOSIS — D692 Other nonthrombocytopenic purpura: Secondary | ICD-10-CM | POA: Diagnosis not present

## 2020-10-28 DIAGNOSIS — D6869 Other thrombophilia: Secondary | ICD-10-CM | POA: Diagnosis not present

## 2020-10-28 DIAGNOSIS — I700 Atherosclerosis of aorta: Secondary | ICD-10-CM | POA: Diagnosis not present

## 2020-10-28 DIAGNOSIS — F3342 Major depressive disorder, recurrent, in full remission: Secondary | ICD-10-CM | POA: Diagnosis not present

## 2020-10-28 DIAGNOSIS — Z515 Encounter for palliative care: Secondary | ICD-10-CM | POA: Diagnosis not present

## 2020-10-28 DIAGNOSIS — Z6827 Body mass index (BMI) 27.0-27.9, adult: Secondary | ICD-10-CM | POA: Diagnosis not present

## 2020-10-28 DIAGNOSIS — I495 Sick sinus syndrome: Secondary | ICD-10-CM | POA: Diagnosis not present

## 2020-11-11 DIAGNOSIS — Z45018 Encounter for adjustment and management of other part of cardiac pacemaker: Secondary | ICD-10-CM | POA: Diagnosis not present

## 2020-11-11 DIAGNOSIS — I495 Sick sinus syndrome: Secondary | ICD-10-CM | POA: Diagnosis not present

## 2020-11-26 DIAGNOSIS — R519 Headache, unspecified: Secondary | ICD-10-CM | POA: Diagnosis not present

## 2020-11-26 DIAGNOSIS — R6889 Other general symptoms and signs: Secondary | ICD-10-CM | POA: Diagnosis not present

## 2020-11-26 DIAGNOSIS — D696 Thrombocytopenia, unspecified: Secondary | ICD-10-CM | POA: Diagnosis not present

## 2020-11-26 DIAGNOSIS — Z0000 Encounter for general adult medical examination without abnormal findings: Secondary | ICD-10-CM | POA: Diagnosis not present

## 2020-11-26 DIAGNOSIS — J310 Chronic rhinitis: Secondary | ICD-10-CM | POA: Diagnosis not present

## 2020-11-26 DIAGNOSIS — Z79899 Other long term (current) drug therapy: Secondary | ICD-10-CM | POA: Diagnosis not present

## 2020-11-26 DIAGNOSIS — F419 Anxiety disorder, unspecified: Secondary | ICD-10-CM | POA: Diagnosis not present

## 2020-11-26 DIAGNOSIS — Z1331 Encounter for screening for depression: Secondary | ICD-10-CM | POA: Diagnosis not present

## 2020-11-26 DIAGNOSIS — E785 Hyperlipidemia, unspecified: Secondary | ICD-10-CM | POA: Diagnosis not present

## 2020-11-26 DIAGNOSIS — R209 Unspecified disturbances of skin sensation: Secondary | ICD-10-CM | POA: Diagnosis not present

## 2020-12-06 DIAGNOSIS — R202 Paresthesia of skin: Secondary | ICD-10-CM | POA: Diagnosis not present

## 2020-12-06 DIAGNOSIS — G8929 Other chronic pain: Secondary | ICD-10-CM | POA: Diagnosis not present

## 2020-12-06 DIAGNOSIS — M25511 Pain in right shoulder: Secondary | ICD-10-CM | POA: Diagnosis not present

## 2020-12-06 DIAGNOSIS — R209 Unspecified disturbances of skin sensation: Secondary | ICD-10-CM | POA: Diagnosis not present

## 2020-12-29 DIAGNOSIS — G4733 Obstructive sleep apnea (adult) (pediatric): Secondary | ICD-10-CM | POA: Diagnosis not present

## 2021-01-17 DIAGNOSIS — G8929 Other chronic pain: Secondary | ICD-10-CM | POA: Diagnosis not present

## 2021-01-17 DIAGNOSIS — M546 Pain in thoracic spine: Secondary | ICD-10-CM | POA: Diagnosis not present

## 2021-02-01 DIAGNOSIS — E261 Secondary hyperaldosteronism: Secondary | ICD-10-CM | POA: Diagnosis not present

## 2021-02-01 DIAGNOSIS — Z515 Encounter for palliative care: Secondary | ICD-10-CM | POA: Diagnosis not present

## 2021-02-01 DIAGNOSIS — M1611 Unilateral primary osteoarthritis, right hip: Secondary | ICD-10-CM | POA: Diagnosis not present

## 2021-02-01 DIAGNOSIS — Z950 Presence of cardiac pacemaker: Secondary | ICD-10-CM | POA: Diagnosis not present

## 2021-02-01 DIAGNOSIS — I480 Paroxysmal atrial fibrillation: Secondary | ICD-10-CM | POA: Diagnosis not present

## 2021-02-01 DIAGNOSIS — D6869 Other thrombophilia: Secondary | ICD-10-CM | POA: Diagnosis not present

## 2021-02-01 DIAGNOSIS — Z9181 History of falling: Secondary | ICD-10-CM | POA: Diagnosis not present

## 2021-02-01 DIAGNOSIS — Z6828 Body mass index (BMI) 28.0-28.9, adult: Secondary | ICD-10-CM | POA: Diagnosis not present

## 2021-02-01 DIAGNOSIS — I509 Heart failure, unspecified: Secondary | ICD-10-CM | POA: Diagnosis not present

## 2021-02-01 DIAGNOSIS — I495 Sick sinus syndrome: Secondary | ICD-10-CM | POA: Diagnosis not present

## 2021-02-07 DIAGNOSIS — R2689 Other abnormalities of gait and mobility: Secondary | ICD-10-CM | POA: Diagnosis not present

## 2021-02-07 DIAGNOSIS — D696 Thrombocytopenia, unspecified: Secondary | ICD-10-CM | POA: Diagnosis not present

## 2021-02-07 DIAGNOSIS — F419 Anxiety disorder, unspecified: Secondary | ICD-10-CM | POA: Diagnosis not present

## 2021-02-07 DIAGNOSIS — R209 Unspecified disturbances of skin sensation: Secondary | ICD-10-CM | POA: Diagnosis not present

## 2021-02-07 DIAGNOSIS — Z79899 Other long term (current) drug therapy: Secondary | ICD-10-CM | POA: Diagnosis not present

## 2021-02-07 DIAGNOSIS — R519 Headache, unspecified: Secondary | ICD-10-CM | POA: Diagnosis not present

## 2021-02-07 DIAGNOSIS — I480 Paroxysmal atrial fibrillation: Secondary | ICD-10-CM | POA: Diagnosis not present

## 2021-02-07 DIAGNOSIS — F458 Other somatoform disorders: Secondary | ICD-10-CM | POA: Diagnosis not present

## 2021-02-09 ENCOUNTER — Encounter: Payer: Self-pay | Admitting: Allergy and Immunology

## 2021-02-09 ENCOUNTER — Other Ambulatory Visit: Payer: Self-pay

## 2021-02-09 ENCOUNTER — Ambulatory Visit: Payer: PPO | Admitting: Allergy and Immunology

## 2021-02-09 VITALS — BP 110/76 | HR 80 | Resp 16 | Ht <= 58 in | Wt 161.0 lb

## 2021-02-09 DIAGNOSIS — G4733 Obstructive sleep apnea (adult) (pediatric): Secondary | ICD-10-CM | POA: Diagnosis not present

## 2021-02-09 DIAGNOSIS — K219 Gastro-esophageal reflux disease without esophagitis: Secondary | ICD-10-CM

## 2021-02-09 DIAGNOSIS — J3089 Other allergic rhinitis: Secondary | ICD-10-CM | POA: Diagnosis not present

## 2021-02-09 DIAGNOSIS — G43909 Migraine, unspecified, not intractable, without status migrainosus: Secondary | ICD-10-CM

## 2021-02-09 DIAGNOSIS — J4530 Mild persistent asthma, uncomplicated: Secondary | ICD-10-CM

## 2021-02-09 MED ORDER — IPRATROPIUM BROMIDE 0.06 % NA SOLN: 2.0000 | Freq: Four times a day (QID) | NASAL | 5 refills | Status: DC

## 2021-02-09 MED ORDER — OMEPRAZOLE 40 MG PO CPDR: 40.0000 mg | DELAYED_RELEASE_CAPSULE | Freq: Two times a day (BID) | ORAL | 5 refills | Status: DC

## 2021-02-09 NOTE — Progress Notes (Signed)
- [place] [facility] [place] - [place] [facility] [place]   Dear Dr. Laqueta Due,  Thank you for referring Katherine Long to the [facility] on 02/09/2021.   Below is a summation of this patient's evaluation and recommendations.  Thank you for your referral. I will keep you informed about this patient's response to treatment.   If you have any questions please do not hesitate to contact me.   Sincerely,  Jiles Prows, MD Allergy / Immunology [place]   ______________________________________________________________________    NEW PATIENT NOTE  Referring Provider: Ernestene Kiel, MD Primary Provider: Ernestene Kiel, MD Date of office visit: 02/09/2021    Subjective:   Chief Complaint:  Katherine Long (DOB: 06-Sep-1928) is a 87 y.o. female who presents to the clinic on 02/09/2021 with a chief complaint of Allergic Rhinitis  .     HPI: Katherine Long presents to this clinic in evaluation of several issues.  She is here today with her daughter [name].  One of her major issues is the fact that she has "drainage".  She has drainage in her throat that gets hung up in her throat and makes her voice raspy and she is constantly throat clearing and according to her daughter she is constantly making these guttural sounds to clear out her throat.  This has been a persistent issue for years.  She also has some intermittent nasal congestion and some intermittent sneezing although this is not a particularly big complaint.  She can smell and she can taste.  It does not sound as though she has ugly nasal discharge although she does have a lot of clear rhinorrhea.  She does have very dry mouth and she has dry eye.  She uses lubricating drops for her eye.  Most recently she had "coating of her tongue" and was given a mouthwash by her primary care doctor which may have helped somewhat.  She has some  reflux that she treats with Tums twice a day.  She is using Tums for calcium supplementation as well.  If she eats a spicy meal she will get some epigastric discomfort and occasionally some regurgitation.  She drinks no caffeine throughout the day.  She also has headaches.  Her headache is in the back of her head with a predilection for the right side and she feels as though there is a "seam in her head" when she has her headaches.  She feels as though this seems opens.  This is a very hard pain.  It is pressure-like.  There is no associated scotoma or dizziness.  She has this headache every day.  She will take Tylenol and relief of this headache.  She has sleep apnea and is using CPAP.  She has been on CPAP for the past 8 years.  She had a sleep study performed last year and had her pressure increased from 10-12 for some reason.  She has a history of congestive heart failure.  She does get dyspneic if she exerts herself to any extent.  She does not remember being told she ever had childhood asthma or asthma anytime during her adult life.  Past Medical History:  Diagnosis Date   Allergic rhinitis    CAD (coronary artery disease)    a. Mild-mod nonobstructive by cath 09/2012, sx felt due to HFpEF.   Diastolic CHF ([place])    a. 0/9470 - EF 55-60%, grade 1 d/d, mild  LVH.   Fibromyalgia    Hyperlipidemia    Impaired fasting glucose    a. A1C 5.6 in 09/2012.   RBBB    Rib fracture    Sternal fracture    Thrombocytopenia ([place])    a. Noted 09/2012.   Vertebral fracture, closed     Past Surgical History:  Procedure Laterality Date   APPENDECTOMY     CHOLECYSTECTOMY     LEFT HEART CATHETERIZATION WITH CORONARY ANGIOGRAM N/A 10/15/2012   Procedure: LEFT HEART CATHETERIZATION WITH CORONARY ANGIOGRAM;  Surgeon: Burnell Blanks, MD;  [facility]: [facility] C[facility] LAB;  Service: Cardiovascular;  Laterality: N/A;   OPEN REDUCTION INTERNAL FIXATION (ORIF) DISTAL RADIAL FRACTURE Right 07/14/2016   Procedure:  OPEN REDUCTION INTERNAL FIXATION (ORIF) DISTAL RADIAL FRACTURE;  Surgeon: Iran Planas, MD;  Location: [facility];  Service: Orthopedics;  Laterality: Right;   ORIF ULNAR FRACTURE Right 07/14/2016   Procedure: OPEN REDUCTION INTERNAL FIXATION (ORIF) ULNAR FRACTURE;  Surgeon: Iran Planas, MD;  Location: [facility];  Service: Orthopedics;  Laterality: Right;   TONSILLECTOMY AND ADENOIDECTOMY     TUBAL LIGATION      Allergies as of 02/09/2021       Reactions   Penicillins Anaphylaxis, Swelling   Has patient had a PCN reaction causing immediate rash, facial/tongue/throat swelling, SOB or lightheadedness with hypotension: No Has patient had a PCN reaction causing severe rash involving mucus membranes or skin necrosis: No Has patient had a PCN reaction that required hospitalization No Has patient had a PCN reaction occurring within the last 10 years: No If all of the above answers are "NO", then may proceed with Cephalosporin use.   Shellfish-derived Products Anaphylaxis   Effexor Xr [venlafaxine Hcl]    Iodinated Diagnostic Agents Other (See Comments)   Latex    Levaquin [levofloxacin]    Lip swelling   Lopressor [metoprolol Tartrate]    Slow heart rate   Other Other (See Comments)   PT REPORTS THAT NEWSPAPER INK MAKES HER SNEEZE AND EYE ITCH/WATER   Pravastatin Sodium         Medication List    aspirin 81 MG chewable tablet Chew by mouth.   calcium carbonate 750 MG chewable tablet Commonly known as: TUMS EX Chew by mouth.   Cholecalciferol 25 MCG (1000 UT) tablet Take 1,000 Units by mouth. D3   ezetimibe 10 MG tablet Commonly known as: ZETIA Take 10 mg by mouth daily.   flecainide 50 MG tablet Commonly known as: TAMBOCOR Take 50 mg by mouth 2 (two) times daily.   furosemide 20 MG tablet Commonly known as: LASIX Take 20 mg by mouth daily.   montelukast 10 MG tablet Commonly known as: SINGULAIR Take by mouth.   pantoprazole 40 MG tablet Commonly known as: PROTONIX Take 1  tablet (40 mg total) by mouth daily.   senna-docusate 8.6-50 MG tablet Commonly known as: Senokot-S Take 1 tablet by mouth 2 (two) times daily.   sertraline 50 MG tablet Commonly known as: ZOLOFT Take 50 mg by mouth daily.    Review of systems negative except as noted in HPI / PMHx or noted below:  Review of Systems  Constitutional: Negative.   HENT: Negative.    Eyes: Negative.   Respiratory: Negative.    Cardiovascular: Negative.   Gastrointestinal: Negative.   Genitourinary: Negative.   Musculoskeletal: Negative.   Skin: Negative.   Neurological: Negative.   Endo/Heme/Allergies: Negative.   Psychiatric/Behavioral: Negative.     Family History  Problem Relation Age  of Onset   Arrhythmia Mother    Cancer Mother        Kidney   CAD Father 41       Died age 69   CAD Brother 65   Cancer Brother        Lung    Social History   Socioeconomic History   Marital status: Widowed    Spouse name: Not on file   Number of children: 2   Years of education: Not on file   Highest education level: Not on file  Occupational History   Not on file  Tobacco Use   Smoking status: Never   Smokeless tobacco: Never  Substance and Sexual Activity   Alcohol use: No   Drug use: No   Sexual activity: Not on file  Other Topics Concern   Not on file  Social History Narrative   Lives with husband.           Environmental and Social history  Lives in a house with a dry environment, no animals located inside the household, carpet in the bedroom, no plastic on the bed, no plastic on the pillow, and no smoking ongoing with inside the household.  Objective:   Vitals:   02/09/21 0906  BP: 110/76  Pulse: 80  Resp: 16  SpO2: 96%   Height: 4\' 10"  (147.3 cm) Weight: 161 lb (73 kg)  Physical Exam Constitutional:      Appearance: She is not diaphoretic.     Comments: Constant throat clearing, slightly raspy voice  HENT:     Head: Normocephalic.     Right Ear: External ear  normal.     Left Ear: External ear normal.     Ears:     Comments: Bilateral hearing aids    Nose: Nose normal. No mucosal edema or rhinorrhea.     Mouth/Throat:     Pharynx: Uvula midline. No oropharyngeal exudate.  Eyes:     Conjunctiva/sclera: Conjunctivae normal.  Neck:     Thyroid: No thyromegaly.     Trachea: Trachea normal. No tracheal tenderness or tracheal deviation.  Cardiovascular:     Rate and Rhythm: Normal rate and regular rhythm.     Heart sounds: S1 normal and S2 normal. Murmur (Systolic) heard.  Pulmonary:     Effort: No respiratory distress.     Breath sounds: No stridor. Wheezing (Scattered inspiratory and expiratory wheezing posterior lung fields) present. No rales.  Musculoskeletal:     Comments: Compression stockings  Lymphadenopathy:     Head:     Right side of head: No tonsillar adenopathy.     Left side of head: No tonsillar adenopathy.     Cervical: No cervical adenopathy.  Skin:    Findings: No erythema or rash.     Nails: There is no clubbing.  Neurological:     Mental Status: She is alert.    Diagnostics: Allergy skin tests were performed.  She did not demonstrate any hypersensitivity to a screening panel of aeroallergens.  Spirometry was performed and demonstrated an FEV1 of 1.17 @ 85 % of predicted. FEV1/FVC = 0.83  Results of blood tests obtained 06 May 2019 identifies WBC 4.5, absolute eosinophils 0, absolute lymphocyte 700, hemoglobin 12.4, platelet 72  Results of an echocardiogram obtained 09 September 2020:  Upper septal hypertrophy (sigmoid septum), normal variant.  Left ventricular systolic function is normal.  LV ejection fraction = 60%.  The left ventricular wall motion is normal.  Device lead in the  right ventricle  There is mild aortic regurgitation.  Aortic valve mean pressure gradient is 11 mmHg.  Borderline aortic valve stenosis.  The mean gradient across the mitral valve is 3 mmHg.  There is mild mitral regurgitation.   There is mild tricuspid regurgitation.  Estimated right ventricular systolic pressure is 29 mmHg.   Results of a chest CT scan obtained 06 May 2019 identifies the following:  Thoracic inlet: Low-density thyroid nodules that require imaging follow-up in this age group.  Trachea/central airways: Patent.  Mediastinum/hila: Within normal limits.  Heart: Cardiac rhythm management device leads terminating in the right atrium and right ventricle. Aortic and mitral annular calcifications. Coronary artery calcifications. Normal size.  Lungs: Right middle lobe opacities appear linear on coronal reformats and likely represent atelectasis.  Pleura: No pleural effusion or pneumothorax.    Assessment and Plan:    1. Not well controlled mild persistent asthma   2. Perennial allergic rhinitis   3. LPRD (laryngopharyngeal reflux disease)   4. Migraine syndrome   5. OSA (obstructive sleep apnea)     1.  Allergen avoidance measures?  2.  Treat and prevent inflammation:  A. Alvesco 160 - 1 inhalation 2 times a day with spacer (empty lungs) B. Nasacort - 1 spray each nostril 1 time per day  3.  Treat and prevent reflux/LPR:  A. Omeprazole 40 mg - 1 tablet 2 times per day B. Replace throat clearing with swallowing maneuver  4.  Treat and prevent dry eyes/dry mouth:  A. Eliminate all antihistamine use B. Systane eye drops multiple times per day C. Biotene mouthwash multiple times per day  5.  Evaluate headaches:  Obtain nocturnal oximetry while using CPAP   6. If needed:  A. Ipratropium 0.06% - 2 sprays each nostril up to 4 times per day to dry nose B. Albuterol HFA - 2 inhalations every 4-6 hours  7. Obtain fall flu vaccine  8. Return to clinic in 3 weeks or earlier if problem  Deshay has an inflamed and irritated respiratory track.  I am going to have her use anti-inflammatory agents for her entire airway and I am going to treat her for reflux induced respiratory disease as  noted above.  She has headaches and I think we need to make sure that she is not developing nocturnal hypoxemia while using her CPAP machine as a cause for this issue. I will see her back in this clinic in 3 weeks to assess her response to this approach and consider further evaluation and treatment based upon her response.  Jiles Prows, MD Allergy / Immunology [facility]

## 2021-02-09 NOTE — Patient Instructions (Addendum)
  1.  Allergen avoidance measures?  2.  Treat and prevent inflammation:  A. Alvesco 160 - 1 inhalation 2 times a day with spacer (empty lungs) B. Nasacort - 1 spray each nostril 1 time per day  3.  Treat and prevent reflux/LPR:  A. Omeprazole 40 mg - 1 tablet 2 times per day B. Replace throat clearing with swallowing maneuver  4.  Treat and prevent dry eyes/dry mouth:  A. Eliminate all antihistamine use B. Systane eye drops multiple times per day C. Biotene mouthwash multiple times per day  5.  Evaluate headaches:  Obtain nocturnal oximetry while using CPAP   6. If needed:  A. Ipratropium 0.06% - 2 sprays each nostril up to 4 times per day to dry nose B. Albuterol HFA - 2 inhalations every 4-6 hours  7. Obtain fall flu vaccine  8. Return to clinic in 3 weeks or earlier if problem

## 2021-02-10 ENCOUNTER — Encounter: Payer: Self-pay | Admitting: Allergy and Immunology

## 2021-02-10 ENCOUNTER — Telehealth: Payer: Self-pay

## 2021-02-10 DIAGNOSIS — G4733 Obstructive sleep apnea (adult) (pediatric): Secondary | ICD-10-CM | POA: Diagnosis not present

## 2021-02-10 NOTE — Telephone Encounter (Signed)
Nocturnal oximetry testing order was faxed over yesterday 02/09/2021 to [employer]. I called today to confirm they had receive the order form. They already spoke to patient and will be delivering on Monday 26th and picking up on Tuesday 27th.

## 2021-03-01 ENCOUNTER — Encounter: Payer: Self-pay | Admitting: Allergy and Immunology

## 2021-03-02 ENCOUNTER — Ambulatory Visit (INDEPENDENT_AMBULATORY_CARE_PROVIDER_SITE_OTHER): Payer: PPO | Admitting: Allergy and Immunology

## 2021-03-02 ENCOUNTER — Encounter: Payer: Self-pay | Admitting: Allergy and Immunology

## 2021-03-02 ENCOUNTER — Other Ambulatory Visit: Payer: Self-pay

## 2021-03-02 VITALS — BP 122/70 | HR 64 | Resp 20

## 2021-03-02 DIAGNOSIS — J4530 Mild persistent asthma, uncomplicated: Secondary | ICD-10-CM | POA: Diagnosis not present

## 2021-03-02 DIAGNOSIS — G43909 Migraine, unspecified, not intractable, without status migrainosus: Secondary | ICD-10-CM | POA: Diagnosis not present

## 2021-03-02 DIAGNOSIS — Z9181 History of falling: Secondary | ICD-10-CM | POA: Diagnosis not present

## 2021-03-02 DIAGNOSIS — G4730 Sleep apnea, unspecified: Secondary | ICD-10-CM | POA: Diagnosis not present

## 2021-03-02 DIAGNOSIS — I7300 Raynaud's syndrome without gangrene: Secondary | ICD-10-CM | POA: Diagnosis not present

## 2021-03-02 DIAGNOSIS — M797 Fibromyalgia: Secondary | ICD-10-CM | POA: Diagnosis not present

## 2021-03-02 DIAGNOSIS — G4733 Obstructive sleep apnea (adult) (pediatric): Secondary | ICD-10-CM | POA: Diagnosis not present

## 2021-03-02 DIAGNOSIS — E785 Hyperlipidemia, unspecified: Secondary | ICD-10-CM | POA: Diagnosis not present

## 2021-03-02 DIAGNOSIS — I2510 Atherosclerotic heart disease of native coronary artery without angina pectoris: Secondary | ICD-10-CM | POA: Diagnosis not present

## 2021-03-02 DIAGNOSIS — F32A Depression, unspecified: Secondary | ICD-10-CM | POA: Diagnosis not present

## 2021-03-02 DIAGNOSIS — I480 Paroxysmal atrial fibrillation: Secondary | ICD-10-CM | POA: Diagnosis not present

## 2021-03-02 DIAGNOSIS — F419 Anxiety disorder, unspecified: Secondary | ICD-10-CM | POA: Diagnosis not present

## 2021-03-02 DIAGNOSIS — Z7982 Long term (current) use of aspirin: Secondary | ICD-10-CM | POA: Diagnosis not present

## 2021-03-02 DIAGNOSIS — M810 Age-related osteoporosis without current pathological fracture: Secondary | ICD-10-CM | POA: Diagnosis not present

## 2021-03-02 DIAGNOSIS — J3089 Other allergic rhinitis: Secondary | ICD-10-CM | POA: Diagnosis not present

## 2021-03-02 DIAGNOSIS — R296 Repeated falls: Secondary | ICD-10-CM | POA: Diagnosis not present

## 2021-03-02 DIAGNOSIS — K219 Gastro-esophageal reflux disease without esophagitis: Secondary | ICD-10-CM | POA: Diagnosis not present

## 2021-03-02 DIAGNOSIS — D693 Immune thrombocytopenic purpura: Secondary | ICD-10-CM | POA: Diagnosis not present

## 2021-03-02 DIAGNOSIS — S8011XD Contusion of right lower leg, subsequent encounter: Secondary | ICD-10-CM | POA: Diagnosis not present

## 2021-03-02 DIAGNOSIS — J449 Chronic obstructive pulmonary disease, unspecified: Secondary | ICD-10-CM | POA: Diagnosis not present

## 2021-03-02 DIAGNOSIS — M1990 Unspecified osteoarthritis, unspecified site: Secondary | ICD-10-CM | POA: Diagnosis not present

## 2021-03-02 NOTE — Patient Instructions (Addendum)
  1.  Treat and prevent inflammation:  A. Alvesco 160 - 1 inhalation 2 times a day with spacer B. DISCONTINUE Nasacort   2.  Treat and prevent reflux/LPR:  A. Omeprazole 40 mg - 1 tablet 2 times per day B. Replace throat clearing with swallowing maneuver  3.  Treat and prevent dry eyes/dry mouth:  A. Eliminate all antihistamine use B. Systane eye drops multiple times per day C. Biotene mouthwash multiple times per day  4.  If needed:  A. Ipratropium 0.06% - 2 sprays each nostril up to 4 times per day to dry nose B. Albuterol HFA - 2 inhalations every 4-6 hours C. Buffered distilled water in CPAP machine  5. Return to clinic in December 2022  or earlier if problem

## 2021-03-02 NOTE — Progress Notes (Signed)
[place]   Follow-up Note  Referring Provider: Ernestene Kiel, MD Primary Provider: Ernestene Kiel, MD Date of Office Visit: 03/02/2021  Subjective:   Katherine Long (DOB: 01/05/1929) is a 87 y.o. female who returns to the [facility] on 03/02/2021 in re-evaluation of the following:  HPI: Charelle returns to this clinic in evaluation of asthma, allergic rhinitis, LPR, migraine syndrome and obstructive sleep apnea.  She is better in some ways and not better in others.  What is better are her headaches.  She has eliminated coffee consumption and she has really had very little headaches which is a big improvement.  She still has some throat clearing and mucus stuck in her throat and a little bit of cough but she has not been having any coughing spells.  She has no nasal congestion which has never really been a particularly big issue.  She is not really sure that Nasacort has helped her at all.  She is not as dyspneic if she exerts herself at this point.  She has no classic reflux symptoms at this point.  Allergies as of 03/02/2021       Reactions   Penicillins Anaphylaxis, Swelling   Has patient had a PCN reaction causing immediate rash, facial/tongue/throat swelling, SOB or lightheadedness with hypotension: No Has patient had a PCN reaction causing severe rash involving mucus membranes or skin necrosis: No Has patient had a PCN reaction that required hospitalization No Has patient had a PCN reaction occurring within the last 10 years: No If all of the above answers are "NO", then may proceed with Cephalosporin use.   Shellfish-derived Products Anaphylaxis   Effexor Xr [venlafaxine Hcl]    Iodinated Diagnostic Agents Other (See Comments)   Latex    Levaquin [levofloxacin]    Lip swelling   Lopressor [metoprolol Tartrate]    Slow heart rate   Other Other (See Comments)   PT REPORTS THAT NEWSPAPER INK MAKES  HER SNEEZE AND EYE ITCH/WATER   Pravastatin Sodium         Medication List    albuterol 108 (90 Base) MCG/ACT inhaler Commonly known as: VENTOLIN HFA ProAir HFA 90 mcg/actuation aerosol inhaler   ascorbic acid 500 MG tablet Commonly known as: VITAMIN C Vitamin C 500 mg tablet  Take 1 tablet twice a day by oral route for 30 days.   aspirin 81 MG chewable tablet Chew by mouth.   calcium carbonate 750 MG chewable tablet Commonly known as: TUMS EX Chew by mouth.   Cholecalciferol 25 MCG (1000 UT) tablet Take 1,000 Units by mouth. D3   ezetimibe 10 MG tablet Commonly known as: ZETIA Take 10 mg by mouth daily.   flecainide 50 MG tablet Commonly known as: TAMBOCOR Take 50 mg by mouth 2 (two) times daily.   fluticasone 50 MCG/ACT nasal spray Commonly known as: FLONASE Place 1 spray into both nostrils 2 (two) times daily.   furosemide 20 MG tablet Commonly known as: LASIX Take 20 mg by mouth daily.   gabapentin 100 MG capsule Commonly known as: NEURONTIN Take 100 mg by mouth daily as needed.   ipratropium 0.06 % nasal spray Commonly known as: ATROVENT Place 2 sprays into both nostrils 4 (four) times daily.   montelukast 10 MG tablet Commonly known as: SINGULAIR Take by mouth.   omeprazole 40 MG capsule Commonly known as: PRILOSEC Take 1 capsule (40 mg total) by mouth in the morning and  at bedtime.   pantoprazole 40 MG tablet Commonly known as: PROTONIX Take 1 tablet (40 mg total) by mouth daily.   senna-docusate 8.6-50 MG tablet Commonly known as: Senokot-S Take 1 tablet by mouth 2 (two) times daily.   sertraline 50 MG tablet Commonly known as: ZOLOFT Take 50 mg by mouth daily.   triamcinolone cream 0.1 % Commonly known as: KENALOG Apply topically.    Past Medical History:  Diagnosis Date   Allergic rhinitis    CAD (coronary artery disease)    a. Mild-mod nonobstructive by cath 09/2012, sx felt due to HFpEF.   Diastolic CHF ([place])    a. 05/2456 -  EF 55-60%, grade 1 d/d, mild LVH.   Fibromyalgia    Hyperlipidemia    Impaired fasting glucose    a. A1C 5.6 in 09/2012.   RBBB    Rib fracture    Sternal fracture    Thrombocytopenia ([place])    a. Noted 09/2012.   Vertebral fracture, closed     Past Surgical History:  Procedure Laterality Date   APPENDECTOMY     CHOLECYSTECTOMY     LEFT HEART CATHETERIZATION WITH CORONARY ANGIOGRAM N/A 10/15/2012   Procedure: LEFT HEART CATHETERIZATION WITH CORONARY ANGIOGRAM;  Surgeon: Burnell Blanks, MD;  [facility];  Service: Cardiovascular;  Laterality: N/A;   OPEN REDUCTION INTERNAL FIXATION (ORIF) DISTAL RADIAL FRACTURE Right 07/14/2016   Procedure: OPEN REDUCTION INTERNAL FIXATION (ORIF) DISTAL RADIAL FRACTURE;  Surgeon: Iran Planas, MD;  Location: [facility];  Service: Orthopedics;  Laterality: Right;   ORIF ULNAR FRACTURE Right 07/14/2016   Procedure: OPEN REDUCTION INTERNAL FIXATION (ORIF) ULNAR FRACTURE;  Surgeon: Iran Planas, MD;  Location: [facility];  Service: Orthopedics;  Laterality: Right;   TONSILLECTOMY AND ADENOIDECTOMY     TUBAL LIGATION      Review of systems negative except as noted in HPI / PMHx or noted below:  Review of Systems  Constitutional: Negative.   HENT: Negative.    Eyes: Negative.   Respiratory: Negative.    Cardiovascular: Negative.   Gastrointestinal: Negative.   Genitourinary: Negative.   Musculoskeletal: Negative.   Skin: Negative.   Neurological: Negative.   Endo/Heme/Allergies: Negative.   Psychiatric/Behavioral: Negative.      Objective:   Vitals:   03/02/21 1053  BP: 122/70  Pulse: 64  Resp: 20  SpO2: 97%          Physical Exam Constitutional:      Appearance: She is not diaphoretic.  HENT:     Head: Normocephalic.     Right Ear: Ear canal and external ear normal.     Left Ear: Ear canal and external ear normal.     Ears:     Comments: Bilateral hearing aids    Nose: Nose normal. No mucosal edema or rhinorrhea.      Mouth/Throat:     Pharynx: Uvula midline. No oropharyngeal exudate.  Eyes:     Conjunctiva/sclera: Conjunctivae normal.  Neck:     Thyroid: No thyromegaly.     Trachea: Trachea normal. No tracheal tenderness or tracheal deviation.  Cardiovascular:     Rate and Rhythm: Normal rate and regular rhythm.     Heart sounds: S1 normal and S2 normal. Murmur (Systolic) heard.  Pulmonary:     Effort: No respiratory distress.     Breath sounds: Normal breath sounds. No stridor. No wheezing or rales.  Lymphadenopathy:     Head:     Right side of head: No  tonsillar adenopathy.     Left side of head: No tonsillar adenopathy.     Cervical: No cervical adenopathy.  Skin:    Findings: No erythema or rash.     Nails: There is no clubbing.  Neurological:     Mental Status: She is alert.    Diagnostics:    Spirometry was performed and demonstrated an FEV1 of 0.99 at 72 % of predicted.  Results of a nocturnal oximetry study obtained 14 February 2021 while using CPAP identified 0.1 minute of sleep time spent with an oxygen saturation below 88%.  Assessment and Plan:   1. Asthma, well controlled, mild persistent   2. Perennial allergic rhinitis   3. LPRD (laryngopharyngeal reflux disease)   4. Migraine syndrome   5. OSA (obstructive sleep apnea)     1.  Treat and prevent inflammation:  A. Alvesco 160 - 1 inhalation 2 times a day with spacer B. DISCONTINUE Nasacort   2.  Treat and prevent reflux/LPR:  A. Omeprazole 40 mg - 1 tablet 2 times per day B. Replace throat clearing with swallowing maneuver  3.  Treat and prevent dry eyes/dry mouth:  A. Eliminate all antihistamine use B. Systane eye drops multiple times per day C. Biotene mouthwash multiple times per day  4.  If needed:  A. Ipratropium 0.06% - 2 sprays each nostril up to 4 times per day to dry nose B. Albuterol HFA - 2 inhalations every 4-6 hours C. Buffered distilled water in CPAP machine  5. Return to clinic in  December 2022  or earlier if problem  I think that Kinsleigh is better in some ways but she still has room for improvement.  It does appear as though the anti-inflammatory agent for her lower airway has helped her breathing and it does appear as though eliminating coffee consumption has helped her headaches.  But she still has a lot of throat symptoms with drainage and throat clearing and this may be a combination of not just LPR but also her dry mouth issue.  We will see what happens at the end of 12 weeks of therapy which will be in December 2022.  We will consider further evaluation and treatment based upon her response at that point.  Because she has not had any improvement with her drainage issue while using Nasacort we will discontinue this medication.  Allena Katz, MD Allergy / Immunology [place]

## 2021-03-03 ENCOUNTER — Encounter: Payer: Self-pay | Admitting: Allergy and Immunology

## 2021-03-03 DIAGNOSIS — F419 Anxiety disorder, unspecified: Secondary | ICD-10-CM | POA: Diagnosis not present

## 2021-03-03 DIAGNOSIS — M542 Cervicalgia: Secondary | ICD-10-CM | POA: Diagnosis not present

## 2021-03-03 DIAGNOSIS — L89899 Pressure ulcer of other site, unspecified stage: Secondary | ICD-10-CM | POA: Diagnosis not present

## 2021-03-03 MED ORDER — ALBUTEROL SULFATE HFA 108 (90 BASE) MCG/ACT IN AERS: 2.0000 | INHALATION_SPRAY | RESPIRATORY_TRACT | 1 refills | Status: DC | PRN

## 2021-03-03 NOTE — Addendum Note (Signed)
Addended by: Guy Franco on: 03/03/2021 01:40 PM   Modules accepted: Orders

## 2021-03-04 DIAGNOSIS — M542 Cervicalgia: Secondary | ICD-10-CM | POA: Diagnosis not present

## 2021-03-08 ENCOUNTER — Telehealth: Payer: Self-pay | Admitting: Allergy and Immunology

## 2021-03-08 NOTE — Telephone Encounter (Signed)
Patient is requesting a refill on albuterol and alvesco inhaler.   [facility] [address]

## 2021-03-09 MED ORDER — ALVESCO 160 MCG/ACT IN AERS: 1.0000 | INHALATION_SPRAY | Freq: Two times a day (BID) | RESPIRATORY_TRACT | 2 refills | Status: DC

## 2021-03-09 MED ORDER — ALBUTEROL SULFATE HFA 108 (90 BASE) MCG/ACT IN AERS: 2.0000 | INHALATION_SPRAY | RESPIRATORY_TRACT | 1 refills | Status: DC | PRN

## 2021-03-14 DIAGNOSIS — C44722 Squamous cell carcinoma of skin of right lower limb, including hip: Secondary | ICD-10-CM | POA: Diagnosis not present

## 2021-03-14 DIAGNOSIS — L820 Inflamed seborrheic keratosis: Secondary | ICD-10-CM | POA: Diagnosis not present

## 2021-03-17 DIAGNOSIS — M25511 Pain in right shoulder: Secondary | ICD-10-CM | POA: Diagnosis not present

## 2021-03-17 DIAGNOSIS — G8929 Other chronic pain: Secondary | ICD-10-CM | POA: Diagnosis not present

## 2021-03-21 DIAGNOSIS — E785 Hyperlipidemia, unspecified: Secondary | ICD-10-CM | POA: Diagnosis not present

## 2021-03-21 DIAGNOSIS — I2510 Atherosclerotic heart disease of native coronary artery without angina pectoris: Secondary | ICD-10-CM | POA: Diagnosis not present

## 2021-03-21 DIAGNOSIS — I480 Paroxysmal atrial fibrillation: Secondary | ICD-10-CM | POA: Diagnosis not present

## 2021-03-22 DIAGNOSIS — Z1231 Encounter for screening mammogram for malignant neoplasm of breast: Secondary | ICD-10-CM | POA: Diagnosis not present

## 2021-04-01 DIAGNOSIS — F32A Depression, unspecified: Secondary | ICD-10-CM | POA: Diagnosis not present

## 2021-04-01 DIAGNOSIS — M810 Age-related osteoporosis without current pathological fracture: Secondary | ICD-10-CM | POA: Diagnosis not present

## 2021-04-01 DIAGNOSIS — E785 Hyperlipidemia, unspecified: Secondary | ICD-10-CM | POA: Diagnosis not present

## 2021-04-01 DIAGNOSIS — D693 Immune thrombocytopenic purpura: Secondary | ICD-10-CM | POA: Diagnosis not present

## 2021-04-01 DIAGNOSIS — M1990 Unspecified osteoarthritis, unspecified site: Secondary | ICD-10-CM | POA: Diagnosis not present

## 2021-04-01 DIAGNOSIS — I480 Paroxysmal atrial fibrillation: Secondary | ICD-10-CM | POA: Diagnosis not present

## 2021-04-01 DIAGNOSIS — Z7982 Long term (current) use of aspirin: Secondary | ICD-10-CM | POA: Diagnosis not present

## 2021-04-01 DIAGNOSIS — R296 Repeated falls: Secondary | ICD-10-CM | POA: Diagnosis not present

## 2021-04-01 DIAGNOSIS — J449 Chronic obstructive pulmonary disease, unspecified: Secondary | ICD-10-CM | POA: Diagnosis not present

## 2021-04-01 DIAGNOSIS — F419 Anxiety disorder, unspecified: Secondary | ICD-10-CM | POA: Diagnosis not present

## 2021-04-01 DIAGNOSIS — I7300 Raynaud's syndrome without gangrene: Secondary | ICD-10-CM | POA: Diagnosis not present

## 2021-04-01 DIAGNOSIS — I2510 Atherosclerotic heart disease of native coronary artery without angina pectoris: Secondary | ICD-10-CM | POA: Diagnosis not present

## 2021-04-01 DIAGNOSIS — K219 Gastro-esophageal reflux disease without esophagitis: Secondary | ICD-10-CM | POA: Diagnosis not present

## 2021-04-01 DIAGNOSIS — M797 Fibromyalgia: Secondary | ICD-10-CM | POA: Diagnosis not present

## 2021-04-01 DIAGNOSIS — S8011XD Contusion of right lower leg, subsequent encounter: Secondary | ICD-10-CM | POA: Diagnosis not present

## 2021-04-01 DIAGNOSIS — G4730 Sleep apnea, unspecified: Secondary | ICD-10-CM | POA: Diagnosis not present

## 2021-04-01 DIAGNOSIS — Z9181 History of falling: Secondary | ICD-10-CM | POA: Diagnosis not present

## 2021-04-02 DIAGNOSIS — R059 Cough, unspecified: Secondary | ICD-10-CM | POA: Diagnosis not present

## 2021-04-02 DIAGNOSIS — R509 Fever, unspecified: Secondary | ICD-10-CM | POA: Diagnosis not present

## 2021-04-02 DIAGNOSIS — J189 Pneumonia, unspecified organism: Secondary | ICD-10-CM | POA: Diagnosis not present

## 2021-04-02 DIAGNOSIS — J440 Chronic obstructive pulmonary disease with acute lower respiratory infection: Secondary | ICD-10-CM | POA: Diagnosis not present

## 2021-04-20 DIAGNOSIS — I2510 Atherosclerotic heart disease of native coronary artery without angina pectoris: Secondary | ICD-10-CM | POA: Diagnosis not present

## 2021-04-20 DIAGNOSIS — E785 Hyperlipidemia, unspecified: Secondary | ICD-10-CM | POA: Diagnosis not present

## 2021-04-20 DIAGNOSIS — I480 Paroxysmal atrial fibrillation: Secondary | ICD-10-CM | POA: Diagnosis not present

## 2021-04-25 DIAGNOSIS — Z9181 History of falling: Secondary | ICD-10-CM | POA: Diagnosis not present

## 2021-04-25 DIAGNOSIS — Z515 Encounter for palliative care: Secondary | ICD-10-CM | POA: Diagnosis not present

## 2021-04-25 DIAGNOSIS — E261 Secondary hyperaldosteronism: Secondary | ICD-10-CM | POA: Diagnosis not present

## 2021-04-25 DIAGNOSIS — I509 Heart failure, unspecified: Secondary | ICD-10-CM | POA: Diagnosis not present

## 2021-04-25 DIAGNOSIS — I480 Paroxysmal atrial fibrillation: Secondary | ICD-10-CM | POA: Diagnosis not present

## 2021-04-25 DIAGNOSIS — Z950 Presence of cardiac pacemaker: Secondary | ICD-10-CM | POA: Diagnosis not present

## 2021-04-25 DIAGNOSIS — Z7982 Long term (current) use of aspirin: Secondary | ICD-10-CM | POA: Diagnosis not present

## 2021-04-25 DIAGNOSIS — I495 Sick sinus syndrome: Secondary | ICD-10-CM | POA: Diagnosis not present

## 2021-04-25 DIAGNOSIS — D6869 Other thrombophilia: Secondary | ICD-10-CM | POA: Diagnosis not present

## 2021-05-09 ENCOUNTER — Encounter: Payer: Self-pay | Admitting: Allergy and Immunology

## 2021-05-09 ENCOUNTER — Ambulatory Visit (INDEPENDENT_AMBULATORY_CARE_PROVIDER_SITE_OTHER): Payer: PPO | Admitting: Allergy and Immunology

## 2021-05-09 ENCOUNTER — Other Ambulatory Visit: Payer: Self-pay

## 2021-05-09 VITALS — BP 114/72 | HR 72 | Resp 18

## 2021-05-09 DIAGNOSIS — J3089 Other allergic rhinitis: Secondary | ICD-10-CM | POA: Diagnosis not present

## 2021-05-09 DIAGNOSIS — J4540 Moderate persistent asthma, uncomplicated: Secondary | ICD-10-CM

## 2021-05-09 DIAGNOSIS — G43909 Migraine, unspecified, not intractable, without status migrainosus: Secondary | ICD-10-CM | POA: Diagnosis not present

## 2021-05-09 DIAGNOSIS — M3500 Sicca syndrome, unspecified: Secondary | ICD-10-CM | POA: Diagnosis not present

## 2021-05-09 DIAGNOSIS — K219 Gastro-esophageal reflux disease without esophagitis: Secondary | ICD-10-CM

## 2021-05-09 MED ORDER — IPRATROPIUM BROMIDE 0.06 % NA SOLN: NASAL | 5 refills | Status: AC

## 2021-05-09 MED ORDER — ALBUTEROL SULFATE HFA 108 (90 BASE) MCG/ACT IN AERS: INHALATION_SPRAY | RESPIRATORY_TRACT | 1 refills | Status: AC

## 2021-05-09 MED ORDER — PANTOPRAZOLE SODIUM 40 MG PO TBEC: 40.0000 mg | DELAYED_RELEASE_TABLET | Freq: Two times a day (BID) | ORAL | 5 refills | Status: AC

## 2021-05-09 MED ORDER — BREZTRI AEROSPHERE 160-9-4.8 MCG/ACT IN AERO: INHALATION_SPRAY | RESPIRATORY_TRACT | 5 refills | Status: AC

## 2021-05-09 NOTE — Progress Notes (Signed)
[facility]   Follow-up Note  Referring Provider: Ernestene Kiel, MD Primary Provider: Ernestene Kiel, MD Date of Office Visit: 05/09/2021  Subjective:   Katherine Long (DOB: 1928-06-08) is a 87 y.o. female who returns to the [facility] [place] on 05/09/2021 in re-evaluation of the following:  HPI: Lagena returns to this clinic in evaluation of asthma, allergic rhinitis, LPR, migraine syndrome, sicca syndrome, and obstructive sleep apnea.  Her last visit to this clinic was 02 March 2021.  Once again she is better in some ways and not better in others.    She definitely has much better control of her headaches and this is not a particularly big issue at this point in time.  She still occasionally gets mucus stuck in her throat and some throat clearing and she has had coughing.  She has had more cough recently ever since she contracted a "cold" about 5 days ago.  She has not had any high fever or ugly nasal discharge or sputum production or chest pain.  She is actually a little bit better regarding this issue today.  She has no classic reflux symptoms.  Her eyes are doing well with wetting solution.  She still has some problems with dry mouth and she is not sure that the Biotene mouthwash has helped her much.  She continues on CPAP for her sleep apnea.  Allergies as of 05/09/2021       Reactions   Penicillins Anaphylaxis, Swelling   Has patient had a PCN reaction causing immediate rash, facial/tongue/throat swelling, SOB or lightheadedness with hypotension: No Has patient had a PCN reaction causing severe rash involving mucus membranes or skin necrosis: No Has patient had a PCN reaction that required hospitalization No Has patient had a PCN reaction occurring within the last 10 years: No If all of the above answers are "NO", then may proceed with Cephalosporin use.   Shellfish-derived Products Anaphylaxis    Effexor Xr [venlafaxine Hcl]    Iodinated Diagnostic Agents Other (See Comments)   Latex    Levaquin [levofloxacin]    Lip swelling   Lopressor [metoprolol Tartrate]    Slow heart rate   Other Other (See Comments)   PT REPORTS THAT NEWSPAPER INK MAKES HER SNEEZE AND EYE ITCH/WATER   Pravastatin Sodium         Medication List    albuterol 108 (90 Base) MCG/ACT inhaler Commonly known as: VENTOLIN HFA Inhale 2 puffs into the lungs every 4 (four) hours as needed for wheezing or shortness of breath.   Alvesco 160 MCG/ACT inhaler Generic drug: ciclesonide Inhale 1 puff into the lungs 2 (two) times daily.   ascorbic acid 500 MG tablet Commonly known as: VITAMIN C Vitamin C 500 mg tablet  Take 1 tablet twice a day by oral route for 30 days.   aspirin 81 MG chewable tablet Chew by mouth.   calcium carbonate 750 MG chewable tablet Commonly known as: TUMS EX Chew by mouth.   Cholecalciferol 25 MCG (1000 UT) tablet Take 1,000 Units by mouth. D3   ezetimibe 10 MG tablet Commonly known as: ZETIA Take 10 mg by mouth daily.   flecainide 50 MG tablet Commonly known as: TAMBOCOR Take 50 mg by mouth 2 (two) times daily.   furosemide 20 MG tablet Commonly known as: LASIX Take 20 mg by mouth daily.   ipratropium 0.06 % nasal spray Commonly known as: ATROVENT Place 2 sprays into both  nostrils 4 (four) times daily.   montelukast 10 MG tablet Commonly known as: SINGULAIR Take by mouth.   pantoprazole 40 MG tablet Commonly known as: PROTONIX Take 1 tablet (40 mg total) by mouth daily.   senna-docusate 8.6-50 MG tablet Commonly known as: Senokot-S Take 1 tablet by mouth 2 (two) times daily.   sertraline 50 MG tablet Commonly known as: ZOLOFT Take 50 mg by mouth daily.   triamcinolone cream 0.1 % Commonly known as: KENALOG Apply topically.    Past Medical History:  Diagnosis Date   Allergic rhinitis    CAD (coronary artery disease)    a. Mild-mod nonobstructive  by cath 09/2012, sx felt due to HFpEF.   Diastolic CHF ([place])    a. 11/7822 - EF 55-60%, grade 1 d/d, mild LVH.   Fibromyalgia    Hyperlipidemia    Impaired fasting glucose    a. A1C 5.6 in 09/2012.   RBBB    Rib fracture    Sternal fracture    Thrombocytopenia ([place])    a. Noted 09/2012.   Vertebral fracture, closed     Past Surgical History:  Procedure Laterality Date   APPENDECTOMY     CHOLECYSTECTOMY     LEFT HEART CATHETERIZATION WITH CORONARY ANGIOGRAM N/A 10/15/2012   Procedure: LEFT HEART CATHETERIZATION WITH CORONARY ANGIOGRAM;  Surgeon: Burnell Blanks, MD;  Location: [facility] CATH LAB;  Service: Cardiovascular;  Laterality: N/A;   OPEN REDUCTION INTERNAL FIXATION (ORIF) DISTAL RADIAL FRACTURE Right 07/14/2016   Procedure: OPEN REDUCTION INTERNAL FIXATION (ORIF) DISTAL RADIAL FRACTURE;  Surgeon: Iran Planas, MD;  Location: [facility];  Service: Orthopedics;  Laterality: Right;   ORIF ULNAR FRACTURE Right 07/14/2016   Procedure: OPEN REDUCTION INTERNAL FIXATION (ORIF) ULNAR FRACTURE;  Surgeon: Iran Planas, MD;  Location: [place];  Service: Orthopedics;  Laterality: Right;   TONSILLECTOMY AND ADENOIDECTOMY     TUBAL LIGATION      Review of systems negative except as noted in HPI / PMHx or noted below:  Review of Systems  Constitutional: Negative.   HENT: Negative.    Eyes: Negative.   Respiratory: Negative.    Cardiovascular: Negative.   Gastrointestinal: Negative.   Genitourinary: Negative.   Musculoskeletal: Negative.   Skin: Negative.   Neurological: Negative.   Endo/Heme/Allergies: Negative.   Psychiatric/Behavioral: Negative.      Objective:   Vitals:   05/09/21 1500  BP: 114/72  Pulse: 72  Resp: 18  SpO2: 95%          Physical Exam Constitutional:      Appearance: She is not diaphoretic.  HENT:     Head: Normocephalic.     Right Ear: Tympanic membrane, ear canal and external ear normal.     Left Ear: Tympanic membrane, ear canal and external ear  normal.     Nose: Nose normal. No mucosal edema or rhinorrhea.     Mouth/Throat:     Pharynx: Uvula midline. No oropharyngeal exudate.  Eyes:     Conjunctiva/sclera: Conjunctivae normal.  Neck:     Thyroid: No thyromegaly.     Trachea: Trachea normal. No tracheal tenderness or tracheal deviation.  Cardiovascular:     Rate and Rhythm: Normal rate and regular rhythm.     Heart sounds: S1 normal and S2 normal. Murmur (Systolic) heard.  Pulmonary:     Effort: No respiratory distress.     Breath sounds: No stridor. Wheezing (Bilateral expiratory wheezes both lung fields) present. No rales.  Lymphadenopathy:  Head:     Right side of head: No tonsillar adenopathy.     Left side of head: No tonsillar adenopathy.     Cervical: No cervical adenopathy.  Skin:    Findings: No erythema or rash.     Nails: There is no clubbing.  Neurological:     Mental Status: She is alert.    Diagnostics:    Spirometry was performed and demonstrated an FEV1 of 1.18 at 86 % of predicted.  Assessment and Plan:   1. Not well controlled moderate persistent asthma   2. Perennial allergic rhinitis   3. LPRD (laryngopharyngeal reflux disease)   4. Migraine syndrome   5. Sicca syndrome ([place])     1.  Treat and prevent inflammation  A. START Breztri - 2 inhalations 2 times a day with spacer (replaces Alvesco)  2.  Treat and prevent reflux/LPR:  A. INCREASE Protonix 40 mg - 1 tablet 2 times per day B. Replace throat clearing with swallowing maneuver  3.  Treat and prevent dry eyes/dry mouth:  A. Eliminate all antihistamine use B. Systane eye drops multiple times per day C. Biotene mouthwash multiple times per day  4.  If needed:  A. Ipratropium 0.06% - 2 sprays each nostril up to 4 times per day to dry nose B. Albuterol HFA - 2 inhalations every 4-6 hours  5. Return to clinic in 8 weeks or earlier if problem  I am going to start [place] on a triple inhaler to hopeful eliminate any  inflammation in her lower airway.  I will also have her increase her Protonix to twice a day to treat LPR.  She has a selection of other agents that she can utilize should they be required.  She does have a history of dry mouth and hopefully the anticholinergic agents were found within [place] he will not exacerbate this issue.  I will regroup with her in 8 weeks or earlier if there is a problem.  Allena Katz, MD Allergy / Immunology [place]

## 2021-05-09 NOTE — Patient Instructions (Addendum)
°  1.  Treat and prevent inflammation  A. START Breztri - 2 inhalations 2 times a day with spacer (replaces Alvesco)  2.  Treat and prevent reflux/LPR:  A. INCREASE Protonix 40 mg - 1 tablet 2 times per day B. Replace throat clearing with swallowing maneuver  3.  Treat and prevent dry eyes/dry mouth:  A. Eliminate all antihistamine use B. Systane eye drops multiple times per day C. Biotene mouthwash multiple times per day  4.  If needed:  A. Ipratropium 0.06% - 2 sprays each nostril up to 4 times per day to dry nose B. Albuterol HFA - 2 inhalations every 4-6 hours  5. Return to clinic in 8 weeks or earlier if problem

## 2021-05-10 ENCOUNTER — Encounter: Payer: Self-pay | Admitting: Allergy and Immunology

## 2021-05-10 DIAGNOSIS — L570 Actinic keratosis: Secondary | ICD-10-CM | POA: Diagnosis not present

## 2021-05-10 DIAGNOSIS — C44722 Squamous cell carcinoma of skin of right lower limb, including hip: Secondary | ICD-10-CM | POA: Diagnosis not present

## 2021-06-03 DIAGNOSIS — J45909 Unspecified asthma, uncomplicated: Secondary | ICD-10-CM | POA: Diagnosis not present

## 2021-06-03 DIAGNOSIS — K219 Gastro-esophageal reflux disease without esophagitis: Secondary | ICD-10-CM | POA: Diagnosis not present

## 2021-06-03 DIAGNOSIS — I480 Paroxysmal atrial fibrillation: Secondary | ICD-10-CM | POA: Diagnosis not present

## 2021-06-03 DIAGNOSIS — R1310 Dysphagia, unspecified: Secondary | ICD-10-CM | POA: Diagnosis not present

## 2021-06-03 DIAGNOSIS — Z6830 Body mass index (BMI) 30.0-30.9, adult: Secondary | ICD-10-CM | POA: Diagnosis not present

## 2021-06-07 DIAGNOSIS — R1312 Dysphagia, oropharyngeal phase: Secondary | ICD-10-CM | POA: Diagnosis not present

## 2021-06-24 DIAGNOSIS — W19XXXA Unspecified fall, initial encounter: Secondary | ICD-10-CM | POA: Diagnosis not present

## 2021-06-24 DIAGNOSIS — S22050B Wedge compression fracture of T5-T6 vertebra, initial encounter for open fracture: Secondary | ICD-10-CM | POA: Diagnosis not present

## 2021-06-24 DIAGNOSIS — S300XXA Contusion of lower back and pelvis, initial encounter: Secondary | ICD-10-CM | POA: Diagnosis not present

## 2021-06-24 DIAGNOSIS — R42 Dizziness and giddiness: Secondary | ICD-10-CM | POA: Diagnosis not present

## 2021-06-30 DIAGNOSIS — Z950 Presence of cardiac pacemaker: Secondary | ICD-10-CM | POA: Diagnosis not present

## 2021-06-30 DIAGNOSIS — I517 Cardiomegaly: Secondary | ICD-10-CM | POA: Diagnosis not present

## 2021-06-30 DIAGNOSIS — Z23 Encounter for immunization: Secondary | ICD-10-CM | POA: Diagnosis not present

## 2021-06-30 DIAGNOSIS — S32591A Other specified fracture of right pubis, initial encounter for closed fracture: Secondary | ICD-10-CM | POA: Diagnosis not present

## 2021-06-30 DIAGNOSIS — I509 Heart failure, unspecified: Secondary | ICD-10-CM | POA: Diagnosis not present

## 2021-06-30 DIAGNOSIS — Z79899 Other long term (current) drug therapy: Secondary | ICD-10-CM | POA: Diagnosis not present

## 2021-06-30 DIAGNOSIS — M25551 Pain in right hip: Secondary | ICD-10-CM | POA: Diagnosis not present

## 2021-06-30 DIAGNOSIS — R58 Hemorrhage, not elsewhere classified: Secondary | ICD-10-CM | POA: Diagnosis not present

## 2021-06-30 DIAGNOSIS — I4891 Unspecified atrial fibrillation: Secondary | ICD-10-CM | POA: Diagnosis not present

## 2021-06-30 DIAGNOSIS — S32511A Fracture of superior rim of right pubis, initial encounter for closed fracture: Secondary | ICD-10-CM | POA: Diagnosis not present

## 2021-06-30 DIAGNOSIS — J45909 Unspecified asthma, uncomplicated: Secondary | ICD-10-CM | POA: Diagnosis not present

## 2021-06-30 DIAGNOSIS — K219 Gastro-esophageal reflux disease without esophagitis: Secondary | ICD-10-CM | POA: Diagnosis not present

## 2021-06-30 DIAGNOSIS — R102 Pelvic and perineal pain: Secondary | ICD-10-CM | POA: Diagnosis not present

## 2021-06-30 DIAGNOSIS — Z20822 Contact with and (suspected) exposure to covid-19: Secondary | ICD-10-CM | POA: Diagnosis not present

## 2021-06-30 DIAGNOSIS — G8929 Other chronic pain: Secondary | ICD-10-CM | POA: Diagnosis not present

## 2021-06-30 DIAGNOSIS — W19XXXA Unspecified fall, initial encounter: Secondary | ICD-10-CM | POA: Diagnosis not present

## 2021-06-30 DIAGNOSIS — S51011A Laceration without foreign body of right elbow, initial encounter: Secondary | ICD-10-CM | POA: Diagnosis not present

## 2021-06-30 DIAGNOSIS — M546 Pain in thoracic spine: Secondary | ICD-10-CM | POA: Diagnosis not present

## 2021-07-01 DIAGNOSIS — M19011 Primary osteoarthritis, right shoulder: Secondary | ICD-10-CM | POA: Diagnosis not present

## 2021-07-01 DIAGNOSIS — W19XXXA Unspecified fall, initial encounter: Secondary | ICD-10-CM | POA: Diagnosis not present

## 2021-07-01 DIAGNOSIS — S51011A Laceration without foreign body of right elbow, initial encounter: Secondary | ICD-10-CM | POA: Diagnosis not present

## 2021-07-01 DIAGNOSIS — S6991XA Unspecified injury of right wrist, hand and finger(s), initial encounter: Secondary | ICD-10-CM | POA: Diagnosis not present

## 2021-07-01 DIAGNOSIS — G4733 Obstructive sleep apnea (adult) (pediatric): Secondary | ICD-10-CM | POA: Diagnosis not present

## 2021-07-01 DIAGNOSIS — S51811A Laceration without foreign body of right forearm, initial encounter: Secondary | ICD-10-CM | POA: Diagnosis not present

## 2021-07-01 DIAGNOSIS — S32591A Other specified fracture of right pubis, initial encounter for closed fracture: Secondary | ICD-10-CM | POA: Diagnosis not present

## 2021-07-02 DIAGNOSIS — S32591A Other specified fracture of right pubis, initial encounter for closed fracture: Secondary | ICD-10-CM | POA: Diagnosis not present

## 2021-07-02 DIAGNOSIS — W19XXXA Unspecified fall, initial encounter: Secondary | ICD-10-CM | POA: Diagnosis not present

## 2021-07-02 DIAGNOSIS — S51011A Laceration without foreign body of right elbow, initial encounter: Secondary | ICD-10-CM | POA: Diagnosis not present

## 2021-07-03 DIAGNOSIS — S32591A Other specified fracture of right pubis, initial encounter for closed fracture: Secondary | ICD-10-CM | POA: Diagnosis not present

## 2021-07-03 DIAGNOSIS — W19XXXA Unspecified fall, initial encounter: Secondary | ICD-10-CM | POA: Diagnosis not present

## 2021-07-03 DIAGNOSIS — S51011A Laceration without foreign body of right elbow, initial encounter: Secondary | ICD-10-CM | POA: Diagnosis not present

## 2021-07-04 DIAGNOSIS — S51011A Laceration without foreign body of right elbow, initial encounter: Secondary | ICD-10-CM | POA: Diagnosis not present

## 2021-07-04 DIAGNOSIS — W19XXXA Unspecified fall, initial encounter: Secondary | ICD-10-CM | POA: Diagnosis not present

## 2021-07-04 DIAGNOSIS — I480 Paroxysmal atrial fibrillation: Secondary | ICD-10-CM | POA: Diagnosis not present

## 2021-07-04 DIAGNOSIS — I495 Sick sinus syndrome: Secondary | ICD-10-CM | POA: Diagnosis not present

## 2021-07-04 DIAGNOSIS — S32591A Other specified fracture of right pubis, initial encounter for closed fracture: Secondary | ICD-10-CM | POA: Diagnosis not present

## 2021-07-05 DIAGNOSIS — Z9181 History of falling: Secondary | ICD-10-CM | POA: Diagnosis not present

## 2021-07-05 DIAGNOSIS — S32599D Other specified fracture of unspecified pubis, subsequent encounter for fracture with routine healing: Secondary | ICD-10-CM | POA: Diagnosis not present

## 2021-07-05 DIAGNOSIS — E785 Hyperlipidemia, unspecified: Secondary | ICD-10-CM | POA: Diagnosis not present

## 2021-07-05 DIAGNOSIS — F339 Major depressive disorder, recurrent, unspecified: Secondary | ICD-10-CM | POA: Diagnosis not present

## 2021-07-05 DIAGNOSIS — I480 Paroxysmal atrial fibrillation: Secondary | ICD-10-CM | POA: Diagnosis not present

## 2021-07-05 DIAGNOSIS — R279 Unspecified lack of coordination: Secondary | ICD-10-CM | POA: Diagnosis not present

## 2021-07-05 DIAGNOSIS — I509 Heart failure, unspecified: Secondary | ICD-10-CM | POA: Diagnosis not present

## 2021-07-05 DIAGNOSIS — R2689 Other abnormalities of gait and mobility: Secondary | ICD-10-CM | POA: Diagnosis not present

## 2021-07-05 DIAGNOSIS — M6259 Muscle wasting and atrophy, not elsewhere classified, multiple sites: Secondary | ICD-10-CM | POA: Diagnosis not present

## 2021-07-05 DIAGNOSIS — R278 Other lack of coordination: Secondary | ICD-10-CM | POA: Diagnosis not present

## 2021-07-05 DIAGNOSIS — I471 Supraventricular tachycardia: Secondary | ICD-10-CM | POA: Diagnosis not present

## 2021-07-05 DIAGNOSIS — W19XXXA Unspecified fall, initial encounter: Secondary | ICD-10-CM | POA: Diagnosis not present

## 2021-07-05 DIAGNOSIS — M6281 Muscle weakness (generalized): Secondary | ICD-10-CM | POA: Diagnosis not present

## 2021-07-05 DIAGNOSIS — S51011A Laceration without foreign body of right elbow, initial encounter: Secondary | ICD-10-CM | POA: Diagnosis not present

## 2021-07-05 DIAGNOSIS — R296 Repeated falls: Secondary | ICD-10-CM | POA: Diagnosis not present

## 2021-07-05 DIAGNOSIS — I491 Atrial premature depolarization: Secondary | ICD-10-CM | POA: Diagnosis not present

## 2021-07-05 DIAGNOSIS — S32599A Other specified fracture of unspecified pubis, initial encounter for closed fracture: Secondary | ICD-10-CM | POA: Diagnosis not present

## 2021-07-05 DIAGNOSIS — K219 Gastro-esophageal reflux disease without esophagitis: Secondary | ICD-10-CM | POA: Diagnosis not present

## 2021-07-05 DIAGNOSIS — S32591A Other specified fracture of right pubis, initial encounter for closed fracture: Secondary | ICD-10-CM | POA: Diagnosis not present

## 2021-07-05 DIAGNOSIS — F419 Anxiety disorder, unspecified: Secondary | ICD-10-CM | POA: Diagnosis not present

## 2021-07-05 DIAGNOSIS — E559 Vitamin D deficiency, unspecified: Secondary | ICD-10-CM | POA: Diagnosis not present

## 2021-07-05 DIAGNOSIS — R5381 Other malaise: Secondary | ICD-10-CM | POA: Diagnosis not present

## 2021-07-05 DIAGNOSIS — Z7401 Bed confinement status: Secondary | ICD-10-CM | POA: Diagnosis not present

## 2021-07-06 DIAGNOSIS — I480 Paroxysmal atrial fibrillation: Secondary | ICD-10-CM | POA: Diagnosis not present

## 2021-07-06 DIAGNOSIS — I471 Supraventricular tachycardia: Secondary | ICD-10-CM | POA: Diagnosis not present

## 2021-07-06 DIAGNOSIS — S32599D Other specified fracture of unspecified pubis, subsequent encounter for fracture with routine healing: Secondary | ICD-10-CM | POA: Diagnosis not present

## 2021-07-06 DIAGNOSIS — K219 Gastro-esophageal reflux disease without esophagitis: Secondary | ICD-10-CM | POA: Diagnosis not present

## 2021-07-06 DIAGNOSIS — I509 Heart failure, unspecified: Secondary | ICD-10-CM | POA: Diagnosis not present

## 2021-07-06 DIAGNOSIS — I491 Atrial premature depolarization: Secondary | ICD-10-CM | POA: Diagnosis not present

## 2021-07-08 DIAGNOSIS — S32599A Other specified fracture of unspecified pubis, initial encounter for closed fracture: Secondary | ICD-10-CM | POA: Diagnosis not present

## 2021-07-11 ENCOUNTER — Ambulatory Visit: Payer: PPO | Admitting: Allergy and Immunology

## 2021-07-11 DIAGNOSIS — I480 Paroxysmal atrial fibrillation: Secondary | ICD-10-CM | POA: Diagnosis not present

## 2021-07-11 DIAGNOSIS — I509 Heart failure, unspecified: Secondary | ICD-10-CM | POA: Diagnosis not present

## 2021-07-11 DIAGNOSIS — S32599D Other specified fracture of unspecified pubis, subsequent encounter for fracture with routine healing: Secondary | ICD-10-CM | POA: Diagnosis not present

## 2021-07-11 DIAGNOSIS — K219 Gastro-esophageal reflux disease without esophagitis: Secondary | ICD-10-CM | POA: Diagnosis not present

## 2021-07-20 DIAGNOSIS — E559 Vitamin D deficiency, unspecified: Secondary | ICD-10-CM | POA: Diagnosis not present

## 2021-07-20 DIAGNOSIS — I509 Heart failure, unspecified: Secondary | ICD-10-CM | POA: Diagnosis not present

## 2021-07-20 DIAGNOSIS — E785 Hyperlipidemia, unspecified: Secondary | ICD-10-CM | POA: Diagnosis not present

## 2021-07-20 DIAGNOSIS — S32599D Other specified fracture of unspecified pubis, subsequent encounter for fracture with routine healing: Secondary | ICD-10-CM | POA: Diagnosis not present

## 2021-08-01 DIAGNOSIS — I480 Paroxysmal atrial fibrillation: Secondary | ICD-10-CM | POA: Diagnosis not present

## 2021-08-01 DIAGNOSIS — M1990 Unspecified osteoarthritis, unspecified site: Secondary | ICD-10-CM | POA: Diagnosis not present

## 2021-08-01 DIAGNOSIS — I509 Heart failure, unspecified: Secondary | ICD-10-CM | POA: Diagnosis not present

## 2021-08-01 DIAGNOSIS — E559 Vitamin D deficiency, unspecified: Secondary | ICD-10-CM | POA: Diagnosis not present

## 2021-08-01 DIAGNOSIS — F32A Depression, unspecified: Secondary | ICD-10-CM | POA: Diagnosis not present

## 2021-08-01 DIAGNOSIS — I2510 Atherosclerotic heart disease of native coronary artery without angina pectoris: Secondary | ICD-10-CM | POA: Diagnosis not present

## 2021-08-01 DIAGNOSIS — S8011XD Contusion of right lower leg, subsequent encounter: Secondary | ICD-10-CM | POA: Diagnosis not present

## 2021-08-01 DIAGNOSIS — J449 Chronic obstructive pulmonary disease, unspecified: Secondary | ICD-10-CM | POA: Diagnosis not present

## 2021-08-01 DIAGNOSIS — Z79891 Long term (current) use of opiate analgesic: Secondary | ICD-10-CM | POA: Diagnosis not present

## 2021-08-01 DIAGNOSIS — F419 Anxiety disorder, unspecified: Secondary | ICD-10-CM | POA: Diagnosis not present

## 2021-08-01 DIAGNOSIS — K219 Gastro-esophageal reflux disease without esophagitis: Secondary | ICD-10-CM | POA: Diagnosis not present

## 2021-08-01 DIAGNOSIS — Z96651 Presence of right artificial knee joint: Secondary | ICD-10-CM | POA: Diagnosis not present

## 2021-08-01 DIAGNOSIS — Z7982 Long term (current) use of aspirin: Secondary | ICD-10-CM | POA: Diagnosis not present

## 2021-08-01 DIAGNOSIS — Z9181 History of falling: Secondary | ICD-10-CM | POA: Diagnosis not present

## 2021-08-01 DIAGNOSIS — M800AXD Age-related osteoporosis with current pathological fracture, other site, subsequent encounter for fracture with routine healing: Secondary | ICD-10-CM | POA: Diagnosis not present

## 2021-08-01 DIAGNOSIS — Z9989 Dependence on other enabling machines and devices: Secondary | ICD-10-CM | POA: Diagnosis not present

## 2021-08-01 DIAGNOSIS — I471 Supraventricular tachycardia: Secondary | ICD-10-CM | POA: Diagnosis not present

## 2021-08-01 DIAGNOSIS — R296 Repeated falls: Secondary | ICD-10-CM | POA: Diagnosis not present

## 2021-08-01 DIAGNOSIS — S32511A Fracture of superior rim of right pubis, initial encounter for closed fracture: Secondary | ICD-10-CM | POA: Diagnosis not present

## 2021-08-01 DIAGNOSIS — G4730 Sleep apnea, unspecified: Secondary | ICD-10-CM | POA: Diagnosis not present

## 2021-08-01 DIAGNOSIS — M797 Fibromyalgia: Secondary | ICD-10-CM | POA: Diagnosis not present

## 2021-08-01 DIAGNOSIS — M810 Age-related osteoporosis without current pathological fracture: Secondary | ICD-10-CM | POA: Diagnosis not present

## 2021-08-01 DIAGNOSIS — I7300 Raynaud's syndrome without gangrene: Secondary | ICD-10-CM | POA: Diagnosis not present

## 2021-08-01 DIAGNOSIS — D649 Anemia, unspecified: Secondary | ICD-10-CM | POA: Diagnosis not present

## 2021-08-01 DIAGNOSIS — G44219 Episodic tension-type headache, not intractable: Secondary | ICD-10-CM | POA: Diagnosis not present

## 2021-08-03 DIAGNOSIS — Z515 Encounter for palliative care: Secondary | ICD-10-CM | POA: Diagnosis not present

## 2021-08-03 DIAGNOSIS — S329XXD Fracture of unspecified parts of lumbosacral spine and pelvis, subsequent encounter for fracture with routine healing: Secondary | ICD-10-CM | POA: Diagnosis not present

## 2021-08-03 DIAGNOSIS — W19XXXD Unspecified fall, subsequent encounter: Secondary | ICD-10-CM | POA: Diagnosis not present

## 2021-08-10 DIAGNOSIS — M800AXD Age-related osteoporosis with current pathological fracture, other site, subsequent encounter for fracture with routine healing: Secondary | ICD-10-CM | POA: Diagnosis not present

## 2021-08-10 DIAGNOSIS — G4730 Sleep apnea, unspecified: Secondary | ICD-10-CM | POA: Diagnosis not present

## 2021-08-10 DIAGNOSIS — I7300 Raynaud's syndrome without gangrene: Secondary | ICD-10-CM | POA: Diagnosis not present

## 2021-08-10 DIAGNOSIS — H18513 Endothelial corneal dystrophy, bilateral: Secondary | ICD-10-CM | POA: Diagnosis not present

## 2021-08-10 DIAGNOSIS — I2510 Atherosclerotic heart disease of native coronary artery without angina pectoris: Secondary | ICD-10-CM | POA: Diagnosis not present

## 2021-08-10 DIAGNOSIS — M797 Fibromyalgia: Secondary | ICD-10-CM | POA: Diagnosis not present

## 2021-08-10 DIAGNOSIS — M1990 Unspecified osteoarthritis, unspecified site: Secondary | ICD-10-CM | POA: Diagnosis not present

## 2021-08-10 DIAGNOSIS — M810 Age-related osteoporosis without current pathological fracture: Secondary | ICD-10-CM | POA: Diagnosis not present

## 2021-08-10 DIAGNOSIS — I480 Paroxysmal atrial fibrillation: Secondary | ICD-10-CM | POA: Diagnosis not present

## 2021-08-11 DIAGNOSIS — S329XXD Fracture of unspecified parts of lumbosacral spine and pelvis, subsequent encounter for fracture with routine healing: Secondary | ICD-10-CM | POA: Diagnosis not present

## 2021-08-11 DIAGNOSIS — D692 Other nonthrombocytopenic purpura: Secondary | ICD-10-CM | POA: Diagnosis not present

## 2021-08-11 DIAGNOSIS — I495 Sick sinus syndrome: Secondary | ICD-10-CM | POA: Diagnosis not present

## 2021-08-11 DIAGNOSIS — G4733 Obstructive sleep apnea (adult) (pediatric): Secondary | ICD-10-CM | POA: Diagnosis not present

## 2021-08-11 DIAGNOSIS — D6869 Other thrombophilia: Secondary | ICD-10-CM | POA: Diagnosis not present

## 2021-08-11 DIAGNOSIS — I480 Paroxysmal atrial fibrillation: Secondary | ICD-10-CM | POA: Diagnosis not present

## 2021-08-11 DIAGNOSIS — I509 Heart failure, unspecified: Secondary | ICD-10-CM | POA: Diagnosis not present

## 2021-08-11 DIAGNOSIS — W19XXXD Unspecified fall, subsequent encounter: Secondary | ICD-10-CM | POA: Diagnosis not present

## 2021-08-11 DIAGNOSIS — M1611 Unilateral primary osteoarthritis, right hip: Secondary | ICD-10-CM | POA: Diagnosis not present

## 2021-08-11 DIAGNOSIS — I700 Atherosclerosis of aorta: Secondary | ICD-10-CM | POA: Diagnosis not present

## 2021-08-11 DIAGNOSIS — F3342 Major depressive disorder, recurrent, in full remission: Secondary | ICD-10-CM | POA: Diagnosis not present

## 2021-08-11 DIAGNOSIS — J449 Chronic obstructive pulmonary disease, unspecified: Secondary | ICD-10-CM | POA: Diagnosis not present

## 2021-08-17 DIAGNOSIS — J449 Chronic obstructive pulmonary disease, unspecified: Secondary | ICD-10-CM | POA: Diagnosis not present

## 2021-08-17 DIAGNOSIS — I471 Supraventricular tachycardia: Secondary | ICD-10-CM | POA: Diagnosis not present

## 2021-08-17 DIAGNOSIS — G4733 Obstructive sleep apnea (adult) (pediatric): Secondary | ICD-10-CM | POA: Diagnosis not present

## 2021-08-17 DIAGNOSIS — I480 Paroxysmal atrial fibrillation: Secondary | ICD-10-CM | POA: Diagnosis not present

## 2021-08-17 DIAGNOSIS — R0602 Shortness of breath: Secondary | ICD-10-CM | POA: Diagnosis not present

## 2021-08-17 DIAGNOSIS — I495 Sick sinus syndrome: Secondary | ICD-10-CM | POA: Diagnosis not present

## 2021-08-17 DIAGNOSIS — Z950 Presence of cardiac pacemaker: Secondary | ICD-10-CM | POA: Diagnosis not present

## 2021-08-30 DIAGNOSIS — S32599A Other specified fracture of unspecified pubis, initial encounter for closed fracture: Secondary | ICD-10-CM | POA: Diagnosis not present

## 2021-08-31 DIAGNOSIS — Z9181 History of falling: Secondary | ICD-10-CM | POA: Diagnosis not present

## 2021-08-31 DIAGNOSIS — M810 Age-related osteoporosis without current pathological fracture: Secondary | ICD-10-CM | POA: Diagnosis not present

## 2021-08-31 DIAGNOSIS — Z79891 Long term (current) use of opiate analgesic: Secondary | ICD-10-CM | POA: Diagnosis not present

## 2021-08-31 DIAGNOSIS — G44219 Episodic tension-type headache, not intractable: Secondary | ICD-10-CM | POA: Diagnosis not present

## 2021-08-31 DIAGNOSIS — Z96651 Presence of right artificial knee joint: Secondary | ICD-10-CM | POA: Diagnosis not present

## 2021-08-31 DIAGNOSIS — E559 Vitamin D deficiency, unspecified: Secondary | ICD-10-CM | POA: Diagnosis not present

## 2021-08-31 DIAGNOSIS — D649 Anemia, unspecified: Secondary | ICD-10-CM | POA: Diagnosis not present

## 2021-08-31 DIAGNOSIS — I471 Supraventricular tachycardia: Secondary | ICD-10-CM | POA: Diagnosis not present

## 2021-08-31 DIAGNOSIS — I509 Heart failure, unspecified: Secondary | ICD-10-CM | POA: Diagnosis not present

## 2021-08-31 DIAGNOSIS — R296 Repeated falls: Secondary | ICD-10-CM | POA: Diagnosis not present

## 2021-08-31 DIAGNOSIS — M800AXD Age-related osteoporosis with current pathological fracture, other site, subsequent encounter for fracture with routine healing: Secondary | ICD-10-CM | POA: Diagnosis not present

## 2021-08-31 DIAGNOSIS — M1990 Unspecified osteoarthritis, unspecified site: Secondary | ICD-10-CM | POA: Diagnosis not present

## 2021-08-31 DIAGNOSIS — J449 Chronic obstructive pulmonary disease, unspecified: Secondary | ICD-10-CM | POA: Diagnosis not present

## 2021-08-31 DIAGNOSIS — F32A Depression, unspecified: Secondary | ICD-10-CM | POA: Diagnosis not present

## 2021-08-31 DIAGNOSIS — F419 Anxiety disorder, unspecified: Secondary | ICD-10-CM | POA: Diagnosis not present

## 2021-08-31 DIAGNOSIS — I2510 Atherosclerotic heart disease of native coronary artery without angina pectoris: Secondary | ICD-10-CM | POA: Diagnosis not present

## 2021-08-31 DIAGNOSIS — I7300 Raynaud's syndrome without gangrene: Secondary | ICD-10-CM | POA: Diagnosis not present

## 2021-08-31 DIAGNOSIS — Z7982 Long term (current) use of aspirin: Secondary | ICD-10-CM | POA: Diagnosis not present

## 2021-08-31 DIAGNOSIS — S8011XD Contusion of right lower leg, subsequent encounter: Secondary | ICD-10-CM | POA: Diagnosis not present

## 2021-08-31 DIAGNOSIS — G4730 Sleep apnea, unspecified: Secondary | ICD-10-CM | POA: Diagnosis not present

## 2021-08-31 DIAGNOSIS — Z9989 Dependence on other enabling machines and devices: Secondary | ICD-10-CM | POA: Diagnosis not present

## 2021-08-31 DIAGNOSIS — I480 Paroxysmal atrial fibrillation: Secondary | ICD-10-CM | POA: Diagnosis not present

## 2021-08-31 DIAGNOSIS — M797 Fibromyalgia: Secondary | ICD-10-CM | POA: Diagnosis not present

## 2021-08-31 DIAGNOSIS — K219 Gastro-esophageal reflux disease without esophagitis: Secondary | ICD-10-CM | POA: Diagnosis not present

## 2021-09-02 DIAGNOSIS — S32599A Other specified fracture of unspecified pubis, initial encounter for closed fracture: Secondary | ICD-10-CM | POA: Diagnosis not present

## 2021-09-02 DIAGNOSIS — M19011 Primary osteoarthritis, right shoulder: Secondary | ICD-10-CM | POA: Diagnosis not present

## 2021-09-02 DIAGNOSIS — M25511 Pain in right shoulder: Secondary | ICD-10-CM | POA: Diagnosis not present

## 2021-09-02 DIAGNOSIS — R2681 Unsteadiness on feet: Secondary | ICD-10-CM | POA: Diagnosis not present

## 2021-09-02 DIAGNOSIS — G8929 Other chronic pain: Secondary | ICD-10-CM | POA: Diagnosis not present

## 2021-09-12 DIAGNOSIS — L728 Other follicular cysts of the skin and subcutaneous tissue: Secondary | ICD-10-CM | POA: Diagnosis not present

## 2021-09-12 DIAGNOSIS — D2239 Melanocytic nevi of other parts of face: Secondary | ICD-10-CM | POA: Diagnosis not present

## 2021-09-12 DIAGNOSIS — L820 Inflamed seborrheic keratosis: Secondary | ICD-10-CM | POA: Diagnosis not present

## 2021-09-29 DIAGNOSIS — Z4509 Encounter for adjustment and management of other cardiac device: Secondary | ICD-10-CM | POA: Diagnosis not present

## 2021-10-11 DIAGNOSIS — J189 Pneumonia, unspecified organism: Secondary | ICD-10-CM | POA: Diagnosis not present

## 2021-10-11 DIAGNOSIS — R509 Fever, unspecified: Secondary | ICD-10-CM | POA: Diagnosis not present

## 2021-10-11 DIAGNOSIS — R051 Acute cough: Secondary | ICD-10-CM | POA: Diagnosis not present

## 2021-10-11 DIAGNOSIS — R6883 Chills (without fever): Secondary | ICD-10-CM | POA: Diagnosis not present

## 2021-10-31 DIAGNOSIS — S0990XA Unspecified injury of head, initial encounter: Secondary | ICD-10-CM | POA: Diagnosis not present

## 2021-10-31 DIAGNOSIS — M25462 Effusion, left knee: Secondary | ICD-10-CM | POA: Diagnosis not present

## 2021-10-31 DIAGNOSIS — Z043 Encounter for examination and observation following other accident: Secondary | ICD-10-CM | POA: Diagnosis not present

## 2021-10-31 DIAGNOSIS — M5030 Other cervical disc degeneration, unspecified cervical region: Secondary | ICD-10-CM | POA: Diagnosis not present

## 2021-10-31 DIAGNOSIS — R42 Dizziness and giddiness: Secondary | ICD-10-CM | POA: Diagnosis not present

## 2021-10-31 DIAGNOSIS — I509 Heart failure, unspecified: Secondary | ICD-10-CM | POA: Diagnosis not present

## 2021-10-31 DIAGNOSIS — R519 Headache, unspecified: Secondary | ICD-10-CM | POA: Diagnosis not present

## 2021-10-31 DIAGNOSIS — M5031 Other cervical disc degeneration,  high cervical region: Secondary | ICD-10-CM | POA: Diagnosis not present

## 2021-10-31 DIAGNOSIS — Z23 Encounter for immunization: Secondary | ICD-10-CM | POA: Diagnosis not present

## 2021-10-31 DIAGNOSIS — S81012A Laceration without foreign body, left knee, initial encounter: Secondary | ICD-10-CM | POA: Diagnosis not present

## 2021-10-31 DIAGNOSIS — W19XXXA Unspecified fall, initial encounter: Secondary | ICD-10-CM | POA: Diagnosis not present

## 2021-10-31 DIAGNOSIS — R58 Hemorrhage, not elsewhere classified: Secondary | ICD-10-CM | POA: Diagnosis not present

## 2021-10-31 DIAGNOSIS — Z7982 Long term (current) use of aspirin: Secondary | ICD-10-CM | POA: Diagnosis not present

## 2021-10-31 DIAGNOSIS — M50321 Other cervical disc degeneration at C4-C5 level: Secondary | ICD-10-CM | POA: Diagnosis not present

## 2021-10-31 DIAGNOSIS — I10 Essential (primary) hypertension: Secondary | ICD-10-CM | POA: Diagnosis not present

## 2021-10-31 DIAGNOSIS — S8002XA Contusion of left knee, initial encounter: Secondary | ICD-10-CM | POA: Diagnosis not present

## 2021-11-01 DIAGNOSIS — S83412A Sprain of medial collateral ligament of left knee, initial encounter: Secondary | ICD-10-CM | POA: Diagnosis not present

## 2021-11-02 DIAGNOSIS — Z9181 History of falling: Secondary | ICD-10-CM | POA: Diagnosis not present

## 2021-11-02 DIAGNOSIS — K219 Gastro-esophageal reflux disease without esophagitis: Secondary | ICD-10-CM | POA: Diagnosis not present

## 2021-11-02 DIAGNOSIS — J449 Chronic obstructive pulmonary disease, unspecified: Secondary | ICD-10-CM | POA: Diagnosis not present

## 2021-11-02 DIAGNOSIS — F32A Depression, unspecified: Secondary | ICD-10-CM | POA: Diagnosis not present

## 2021-11-02 DIAGNOSIS — I4891 Unspecified atrial fibrillation: Secondary | ICD-10-CM | POA: Diagnosis not present

## 2021-11-02 DIAGNOSIS — Z6830 Body mass index (BMI) 30.0-30.9, adult: Secondary | ICD-10-CM | POA: Diagnosis not present

## 2021-11-08 DIAGNOSIS — E261 Secondary hyperaldosteronism: Secondary | ICD-10-CM | POA: Diagnosis not present

## 2021-11-08 DIAGNOSIS — Z950 Presence of cardiac pacemaker: Secondary | ICD-10-CM | POA: Diagnosis not present

## 2021-11-08 DIAGNOSIS — Z7982 Long term (current) use of aspirin: Secondary | ICD-10-CM | POA: Diagnosis not present

## 2021-11-08 DIAGNOSIS — Z9181 History of falling: Secondary | ICD-10-CM | POA: Diagnosis not present

## 2021-11-08 DIAGNOSIS — D6869 Other thrombophilia: Secondary | ICD-10-CM | POA: Diagnosis not present

## 2021-11-08 DIAGNOSIS — W19XXXD Unspecified fall, subsequent encounter: Secondary | ICD-10-CM | POA: Diagnosis not present

## 2021-11-08 DIAGNOSIS — I480 Paroxysmal atrial fibrillation: Secondary | ICD-10-CM | POA: Diagnosis not present

## 2021-11-08 DIAGNOSIS — S8992XD Unspecified injury of left lower leg, subsequent encounter: Secondary | ICD-10-CM | POA: Diagnosis not present

## 2021-11-08 DIAGNOSIS — Z515 Encounter for palliative care: Secondary | ICD-10-CM | POA: Diagnosis not present

## 2021-11-08 DIAGNOSIS — I509 Heart failure, unspecified: Secondary | ICD-10-CM | POA: Diagnosis not present

## 2021-11-08 DIAGNOSIS — I495 Sick sinus syndrome: Secondary | ICD-10-CM | POA: Diagnosis not present

## 2021-11-11 DIAGNOSIS — M436 Torticollis: Secondary | ICD-10-CM | POA: Diagnosis not present

## 2021-11-11 DIAGNOSIS — Z6830 Body mass index (BMI) 30.0-30.9, adult: Secondary | ICD-10-CM | POA: Diagnosis not present

## 2021-11-11 DIAGNOSIS — R2689 Other abnormalities of gait and mobility: Secondary | ICD-10-CM | POA: Diagnosis not present

## 2021-11-11 DIAGNOSIS — S81812D Laceration without foreign body, left lower leg, subsequent encounter: Secondary | ICD-10-CM | POA: Diagnosis not present

## 2021-12-08 DIAGNOSIS — M47812 Spondylosis without myelopathy or radiculopathy, cervical region: Secondary | ICD-10-CM | POA: Diagnosis not present

## 2021-12-14 DIAGNOSIS — I4891 Unspecified atrial fibrillation: Secondary | ICD-10-CM | POA: Diagnosis not present

## 2021-12-14 DIAGNOSIS — I471 Supraventricular tachycardia: Secondary | ICD-10-CM | POA: Diagnosis not present

## 2021-12-14 DIAGNOSIS — Z45018 Encounter for adjustment and management of other part of cardiac pacemaker: Secondary | ICD-10-CM | POA: Diagnosis not present

## 2021-12-25 DIAGNOSIS — R051 Acute cough: Secondary | ICD-10-CM | POA: Diagnosis not present

## 2021-12-25 DIAGNOSIS — J029 Acute pharyngitis, unspecified: Secondary | ICD-10-CM | POA: Diagnosis not present

## 2021-12-29 DIAGNOSIS — G4733 Obstructive sleep apnea (adult) (pediatric): Secondary | ICD-10-CM | POA: Diagnosis not present

## 2022-01-03 DIAGNOSIS — J189 Pneumonia, unspecified organism: Secondary | ICD-10-CM | POA: Diagnosis not present

## 2022-01-03 DIAGNOSIS — R42 Dizziness and giddiness: Secondary | ICD-10-CM | POA: Diagnosis not present

## 2022-01-04 DIAGNOSIS — M9903 Segmental and somatic dysfunction of lumbar region: Secondary | ICD-10-CM | POA: Diagnosis not present

## 2022-01-04 DIAGNOSIS — M5137 Other intervertebral disc degeneration, lumbosacral region: Secondary | ICD-10-CM | POA: Diagnosis not present

## 2022-01-04 DIAGNOSIS — M9904 Segmental and somatic dysfunction of sacral region: Secondary | ICD-10-CM | POA: Diagnosis not present

## 2022-01-04 DIAGNOSIS — M5136 Other intervertebral disc degeneration, lumbar region: Secondary | ICD-10-CM | POA: Diagnosis not present

## 2022-01-04 DIAGNOSIS — M9902 Segmental and somatic dysfunction of thoracic region: Secondary | ICD-10-CM | POA: Diagnosis not present

## 2022-01-04 DIAGNOSIS — M5442 Lumbago with sciatica, left side: Secondary | ICD-10-CM | POA: Diagnosis not present

## 2022-01-09 ENCOUNTER — Encounter: Payer: Self-pay | Admitting: Podiatry

## 2022-01-09 ENCOUNTER — Ambulatory Visit: Payer: PPO | Admitting: Podiatry

## 2022-01-09 DIAGNOSIS — Z6830 Body mass index (BMI) 30.0-30.9, adult: Secondary | ICD-10-CM | POA: Diagnosis not present

## 2022-01-09 DIAGNOSIS — Z1331 Encounter for screening for depression: Secondary | ICD-10-CM | POA: Diagnosis not present

## 2022-01-09 DIAGNOSIS — S42009A Fracture of unspecified part of unspecified clavicle, initial encounter for closed fracture: Secondary | ICD-10-CM | POA: Diagnosis not present

## 2022-01-09 DIAGNOSIS — G629 Polyneuropathy, unspecified: Secondary | ICD-10-CM | POA: Diagnosis not present

## 2022-01-09 DIAGNOSIS — Z0000 Encounter for general adult medical examination without abnormal findings: Secondary | ICD-10-CM | POA: Diagnosis not present

## 2022-01-09 DIAGNOSIS — I4891 Unspecified atrial fibrillation: Secondary | ICD-10-CM | POA: Diagnosis not present

## 2022-01-09 DIAGNOSIS — M25512 Pain in left shoulder: Secondary | ICD-10-CM | POA: Diagnosis not present

## 2022-01-09 NOTE — Progress Notes (Signed)
  Subjective:  Patient ID: Katherine Long, female    DOB: 1928/07/24,   MRN: [id number]  Chief Complaint  Patient presents with   Peripheral Neuropathy      (np) bil great toe pain-buring and stinging    88 y.o. female presents for concern of bilateral burning and tingling in her feet. Relates they are worse at night and mostly in the balls of her feet.  She is not diabetic. She also relates thickened toenails and requesting a nail trim today. . Denies any other pedal complaints. Denies n/v/f/c.   Past Medical History:  Diagnosis Date   Allergic rhinitis    CAD (coronary artery disease)    a. Mild-mod nonobstructive by cath 09/2012, sx felt due to HFpEF.   Diastolic CHF ([place])    a. 05/5398 - EF 55-60%, grade 1 d/d, mild LVH.   Fibromyalgia    Hyperlipidemia    Impaired fasting glucose    a. A1C 5.6 in 09/2012.   RBBB    Rib fracture    Sternal fracture    Thrombocytopenia ([place])    a. Noted 09/2012.   Vertebral fracture, closed     Objective:  Physical Exam: Vascular: DP/PT pulses 2/4 bilateral. CFT <3 seconds. Normal hair growth on digits. No edema.  Skin. No lacerations or abrasions bilateral feet.  Musculoskeletal: MMT 5/5 bilateral lower extremities in DF, PF, Inversion and Eversion. Deceased ROM in DF of ankle joint. No pain to palpation noted about the ball of the foot or proximally.  Neurological: Sensation intact to light touch. Protective sensation slightly diminished bilateral  Assessment:   1. Peripheral polyneuropathy      Plan:  Patient was evaluated and treated and all questions answered. Discussed neuropathy and etiology as well as treatment with patient.  -Discussed and educated patient on foot care, especially with  regards to the vascular, neurological and musculoskeletal systems. . -Discussed supportive shoes at all times and checking feet regularly.  -Patient defers gabapentin at this time.  -Discussed topical medications and vitamin supplements.   -Patient to return as needed.    Lorenda Peck, DPM

## 2022-01-10 DIAGNOSIS — S42002A Fracture of unspecified part of left clavicle, initial encounter for closed fracture: Secondary | ICD-10-CM | POA: Diagnosis not present

## 2022-01-12 ENCOUNTER — Other Ambulatory Visit: Payer: Self-pay | Admitting: *Deleted

## 2022-01-12 NOTE — Patient Outreach (Signed)
  Care Coordination   Initial Visit Note   01/12/2022 Name: Katherine Long MRN: [id number] DOB: 04/16/1929  Katherine Long is a 87 y.o. year old female who sees Ronita Hipps, MD for primary care. I spoke with  Gabriel Carina by phone today.  What matters to the patients health and wellness today?  NO NEEDS MY DAUGHTER IS A NURSE, I'M WELL ATTENDED AND HAVE HAD MY AWV WITH A NEW PCP.   SDOH assessments and interventions completed:  No     Care Coordination Interventions Activated:  No  Care Coordination Interventions:  No, not indicated   Follow up plan: No further intervention required.   Encounter Outcome:  Pt. Refused   Deontrey Massi C. Myrtie Neither, MSN, [facility] Gerontological Nurse Practitioner [facility] Care Management [phone]

## 2022-01-30 DIAGNOSIS — F331 Major depressive disorder, recurrent, moderate: Secondary | ICD-10-CM | POA: Diagnosis not present

## 2022-02-09 DIAGNOSIS — Z515 Encounter for palliative care: Secondary | ICD-10-CM | POA: Diagnosis not present

## 2022-02-09 DIAGNOSIS — E261 Secondary hyperaldosteronism: Secondary | ICD-10-CM | POA: Diagnosis not present

## 2022-02-09 DIAGNOSIS — I480 Paroxysmal atrial fibrillation: Secondary | ICD-10-CM | POA: Diagnosis not present

## 2022-02-09 DIAGNOSIS — Z9181 History of falling: Secondary | ICD-10-CM | POA: Diagnosis not present

## 2022-02-09 DIAGNOSIS — I509 Heart failure, unspecified: Secondary | ICD-10-CM | POA: Diagnosis not present

## 2022-02-09 DIAGNOSIS — D6869 Other thrombophilia: Secondary | ICD-10-CM | POA: Diagnosis not present

## 2022-02-20 DIAGNOSIS — F331 Major depressive disorder, recurrent, moderate: Secondary | ICD-10-CM | POA: Diagnosis not present

## 2022-02-24 DIAGNOSIS — S42002D Fracture of unspecified part of left clavicle, subsequent encounter for fracture with routine healing: Secondary | ICD-10-CM | POA: Diagnosis not present

## 2022-02-24 DIAGNOSIS — M542 Cervicalgia: Secondary | ICD-10-CM | POA: Diagnosis not present

## 2022-02-24 DIAGNOSIS — S42009A Fracture of unspecified part of unspecified clavicle, initial encounter for closed fracture: Secondary | ICD-10-CM | POA: Diagnosis not present

## 2022-03-06 DIAGNOSIS — F331 Major depressive disorder, recurrent, moderate: Secondary | ICD-10-CM | POA: Diagnosis not present

## 2022-03-09 DIAGNOSIS — R509 Fever, unspecified: Secondary | ICD-10-CM | POA: Diagnosis not present

## 2022-03-09 DIAGNOSIS — R051 Acute cough: Secondary | ICD-10-CM | POA: Diagnosis not present

## 2022-03-17 DIAGNOSIS — Z6830 Body mass index (BMI) 30.0-30.9, adult: Secondary | ICD-10-CM | POA: Diagnosis not present

## 2022-03-17 DIAGNOSIS — J189 Pneumonia, unspecified organism: Secondary | ICD-10-CM | POA: Diagnosis not present

## 2022-03-20 DIAGNOSIS — F331 Major depressive disorder, recurrent, moderate: Secondary | ICD-10-CM | POA: Diagnosis not present

## 2022-04-04 DIAGNOSIS — R519 Headache, unspecified: Secondary | ICD-10-CM | POA: Diagnosis not present

## 2022-04-04 DIAGNOSIS — G8929 Other chronic pain: Secondary | ICD-10-CM | POA: Diagnosis not present

## 2022-04-04 DIAGNOSIS — M542 Cervicalgia: Secondary | ICD-10-CM | POA: Diagnosis not present

## 2022-04-10 DIAGNOSIS — F331 Major depressive disorder, recurrent, moderate: Secondary | ICD-10-CM | POA: Diagnosis not present

## 2022-04-27 DIAGNOSIS — I5042 Chronic combined systolic (congestive) and diastolic (congestive) heart failure: Secondary | ICD-10-CM | POA: Diagnosis not present

## 2022-04-27 DIAGNOSIS — I4719 Other supraventricular tachycardia: Secondary | ICD-10-CM | POA: Diagnosis not present

## 2022-04-27 DIAGNOSIS — I495 Sick sinus syndrome: Secondary | ICD-10-CM | POA: Diagnosis not present

## 2022-04-27 DIAGNOSIS — I480 Paroxysmal atrial fibrillation: Secondary | ICD-10-CM | POA: Diagnosis not present

## 2022-04-27 DIAGNOSIS — I452 Bifascicular block: Secondary | ICD-10-CM | POA: Diagnosis not present

## 2022-04-27 DIAGNOSIS — R0609 Other forms of dyspnea: Secondary | ICD-10-CM | POA: Diagnosis not present

## 2022-04-27 DIAGNOSIS — Z950 Presence of cardiac pacemaker: Secondary | ICD-10-CM | POA: Diagnosis not present

## 2022-04-27 DIAGNOSIS — I444 Left anterior fascicular block: Secondary | ICD-10-CM | POA: Diagnosis not present

## 2022-04-27 DIAGNOSIS — R059 Cough, unspecified: Secondary | ICD-10-CM | POA: Diagnosis not present

## 2022-04-27 DIAGNOSIS — I4510 Unspecified right bundle-branch block: Secondary | ICD-10-CM | POA: Diagnosis not present

## 2022-04-27 DIAGNOSIS — R0602 Shortness of breath: Secondary | ICD-10-CM | POA: Diagnosis not present

## 2022-04-27 DIAGNOSIS — R001 Bradycardia, unspecified: Secondary | ICD-10-CM | POA: Diagnosis not present

## 2022-05-03 DIAGNOSIS — L570 Actinic keratosis: Secondary | ICD-10-CM | POA: Diagnosis not present

## 2022-05-03 DIAGNOSIS — R233 Spontaneous ecchymoses: Secondary | ICD-10-CM | POA: Diagnosis not present

## 2022-05-03 DIAGNOSIS — L578 Other skin changes due to chronic exposure to nonionizing radiation: Secondary | ICD-10-CM | POA: Diagnosis not present

## 2022-05-08 DIAGNOSIS — F331 Major depressive disorder, recurrent, moderate: Secondary | ICD-10-CM | POA: Diagnosis not present

## 2022-05-09 DIAGNOSIS — G4733 Obstructive sleep apnea (adult) (pediatric): Secondary | ICD-10-CM | POA: Diagnosis not present

## 2022-05-09 DIAGNOSIS — I340 Nonrheumatic mitral (valve) insufficiency: Secondary | ICD-10-CM | POA: Diagnosis not present

## 2022-05-09 DIAGNOSIS — I495 Sick sinus syndrome: Secondary | ICD-10-CM | POA: Diagnosis not present

## 2022-05-09 DIAGNOSIS — Z950 Presence of cardiac pacemaker: Secondary | ICD-10-CM | POA: Diagnosis not present

## 2022-05-09 DIAGNOSIS — I5030 Unspecified diastolic (congestive) heart failure: Secondary | ICD-10-CM | POA: Diagnosis not present

## 2022-05-09 DIAGNOSIS — I480 Paroxysmal atrial fibrillation: Secondary | ICD-10-CM | POA: Diagnosis not present

## 2022-05-09 DIAGNOSIS — R0602 Shortness of breath: Secondary | ICD-10-CM | POA: Diagnosis not present

## 2022-05-09 DIAGNOSIS — I4719 Other supraventricular tachycardia: Secondary | ICD-10-CM | POA: Diagnosis not present

## 2022-05-09 DIAGNOSIS — I517 Cardiomegaly: Secondary | ICD-10-CM | POA: Diagnosis not present

## 2022-05-24 DIAGNOSIS — G4733 Obstructive sleep apnea (adult) (pediatric): Secondary | ICD-10-CM | POA: Diagnosis not present

## 2022-05-24 DIAGNOSIS — I480 Paroxysmal atrial fibrillation: Secondary | ICD-10-CM | POA: Diagnosis not present

## 2022-05-24 DIAGNOSIS — S42002D Fracture of unspecified part of left clavicle, subsequent encounter for fracture with routine healing: Secondary | ICD-10-CM | POA: Diagnosis not present

## 2022-05-24 DIAGNOSIS — Z515 Encounter for palliative care: Secondary | ICD-10-CM | POA: Diagnosis not present

## 2022-05-24 DIAGNOSIS — I495 Sick sinus syndrome: Secondary | ICD-10-CM | POA: Diagnosis not present

## 2022-05-24 DIAGNOSIS — J4489 Other specified chronic obstructive pulmonary disease: Secondary | ICD-10-CM | POA: Diagnosis not present

## 2022-05-24 DIAGNOSIS — I509 Heart failure, unspecified: Secondary | ICD-10-CM | POA: Diagnosis not present

## 2022-05-24 DIAGNOSIS — I700 Atherosclerosis of aorta: Secondary | ICD-10-CM | POA: Diagnosis not present

## 2022-05-24 DIAGNOSIS — E261 Secondary hyperaldosteronism: Secondary | ICD-10-CM | POA: Diagnosis not present

## 2022-05-24 DIAGNOSIS — D692 Other nonthrombocytopenic purpura: Secondary | ICD-10-CM | POA: Diagnosis not present

## 2022-05-24 DIAGNOSIS — W19XXXD Unspecified fall, subsequent encounter: Secondary | ICD-10-CM | POA: Diagnosis not present

## 2022-05-24 DIAGNOSIS — D6869 Other thrombophilia: Secondary | ICD-10-CM | POA: Diagnosis not present

## 2022-05-25 DIAGNOSIS — R2681 Unsteadiness on feet: Secondary | ICD-10-CM | POA: Diagnosis not present

## 2022-05-25 DIAGNOSIS — G4489 Other headache syndrome: Secondary | ICD-10-CM | POA: Diagnosis not present

## 2022-05-25 DIAGNOSIS — M25512 Pain in left shoulder: Secondary | ICD-10-CM | POA: Diagnosis not present

## 2022-05-25 DIAGNOSIS — R293 Abnormal posture: Secondary | ICD-10-CM | POA: Diagnosis not present

## 2022-05-25 DIAGNOSIS — M6281 Muscle weakness (generalized): Secondary | ICD-10-CM | POA: Diagnosis not present

## 2022-05-25 DIAGNOSIS — M25611 Stiffness of right shoulder, not elsewhere classified: Secondary | ICD-10-CM | POA: Diagnosis not present

## 2022-05-25 DIAGNOSIS — M542 Cervicalgia: Secondary | ICD-10-CM | POA: Diagnosis not present

## 2022-06-15 DIAGNOSIS — I4729 Other ventricular tachycardia: Secondary | ICD-10-CM | POA: Diagnosis not present

## 2022-06-15 DIAGNOSIS — Z950 Presence of cardiac pacemaker: Secondary | ICD-10-CM | POA: Diagnosis not present

## 2022-06-16 DIAGNOSIS — Z45010 Encounter for checking and testing of cardiac pacemaker pulse generator [battery]: Secondary | ICD-10-CM | POA: Diagnosis not present

## 2022-06-22 DIAGNOSIS — F331 Major depressive disorder, recurrent, moderate: Secondary | ICD-10-CM | POA: Diagnosis not present

## 2022-06-23 DIAGNOSIS — M25512 Pain in left shoulder: Secondary | ICD-10-CM | POA: Diagnosis not present

## 2022-06-23 DIAGNOSIS — M25611 Stiffness of right shoulder, not elsewhere classified: Secondary | ICD-10-CM | POA: Diagnosis not present

## 2022-06-23 DIAGNOSIS — R2681 Unsteadiness on feet: Secondary | ICD-10-CM | POA: Diagnosis not present

## 2022-06-23 DIAGNOSIS — R293 Abnormal posture: Secondary | ICD-10-CM | POA: Diagnosis not present

## 2022-06-23 DIAGNOSIS — M6281 Muscle weakness (generalized): Secondary | ICD-10-CM | POA: Diagnosis not present

## 2022-06-23 DIAGNOSIS — M542 Cervicalgia: Secondary | ICD-10-CM | POA: Diagnosis not present

## 2022-06-23 DIAGNOSIS — G4489 Other headache syndrome: Secondary | ICD-10-CM | POA: Diagnosis not present

## 2022-07-03 DIAGNOSIS — L570 Actinic keratosis: Secondary | ICD-10-CM | POA: Diagnosis not present

## 2022-07-03 DIAGNOSIS — L820 Inflamed seborrheic keratosis: Secondary | ICD-10-CM | POA: Diagnosis not present

## 2022-07-17 DIAGNOSIS — F331 Major depressive disorder, recurrent, moderate: Secondary | ICD-10-CM | POA: Diagnosis not present

## 2022-07-25 DIAGNOSIS — M542 Cervicalgia: Secondary | ICD-10-CM | POA: Diagnosis not present

## 2022-08-01 DIAGNOSIS — J209 Acute bronchitis, unspecified: Secondary | ICD-10-CM | POA: Diagnosis not present

## 2022-08-01 DIAGNOSIS — R0981 Nasal congestion: Secondary | ICD-10-CM | POA: Diagnosis not present

## 2022-08-08 DIAGNOSIS — I480 Paroxysmal atrial fibrillation: Secondary | ICD-10-CM | POA: Diagnosis not present

## 2022-08-08 DIAGNOSIS — I452 Bifascicular block: Secondary | ICD-10-CM | POA: Diagnosis not present

## 2022-08-08 DIAGNOSIS — I4719 Other supraventricular tachycardia: Secondary | ICD-10-CM | POA: Diagnosis not present

## 2022-08-11 DIAGNOSIS — H6691 Otitis media, unspecified, right ear: Secondary | ICD-10-CM | POA: Diagnosis not present

## 2022-08-11 DIAGNOSIS — R0981 Nasal congestion: Secondary | ICD-10-CM | POA: Diagnosis not present

## 2022-08-11 DIAGNOSIS — J189 Pneumonia, unspecified organism: Secondary | ICD-10-CM | POA: Diagnosis not present

## 2022-08-14 DIAGNOSIS — H1820 Unspecified corneal edema: Secondary | ICD-10-CM | POA: Diagnosis not present

## 2022-08-14 DIAGNOSIS — H524 Presbyopia: Secondary | ICD-10-CM | POA: Diagnosis not present

## 2022-08-14 DIAGNOSIS — H18513 Endothelial corneal dystrophy, bilateral: Secondary | ICD-10-CM | POA: Diagnosis not present

## 2022-08-14 DIAGNOSIS — H02132 Senile ectropion of right lower eyelid: Secondary | ICD-10-CM | POA: Diagnosis not present

## 2022-08-15 DIAGNOSIS — I480 Paroxysmal atrial fibrillation: Secondary | ICD-10-CM | POA: Diagnosis not present

## 2022-08-15 DIAGNOSIS — I4719 Other supraventricular tachycardia: Secondary | ICD-10-CM | POA: Diagnosis not present

## 2022-08-24 DIAGNOSIS — E876 Hypokalemia: Secondary | ICD-10-CM | POA: Diagnosis not present

## 2022-08-24 DIAGNOSIS — T502X5A Adverse effect of carbonic-anhydrase inhibitors, benzothiadiazides and other diuretics, initial encounter: Secondary | ICD-10-CM | POA: Diagnosis not present

## 2022-09-11 DIAGNOSIS — H02135 Senile ectropion of left lower eyelid: Secondary | ICD-10-CM | POA: Diagnosis not present

## 2022-09-11 DIAGNOSIS — H18513 Endothelial corneal dystrophy, bilateral: Secondary | ICD-10-CM | POA: Diagnosis not present

## 2022-09-11 DIAGNOSIS — H02132 Senile ectropion of right lower eyelid: Secondary | ICD-10-CM | POA: Diagnosis not present

## 2022-09-13 DIAGNOSIS — M542 Cervicalgia: Secondary | ICD-10-CM | POA: Diagnosis not present

## 2022-09-13 DIAGNOSIS — M47812 Spondylosis without myelopathy or radiculopathy, cervical region: Secondary | ICD-10-CM | POA: Diagnosis not present

## 2022-09-13 DIAGNOSIS — M4802 Spinal stenosis, cervical region: Secondary | ICD-10-CM | POA: Diagnosis not present

## 2022-10-02 DIAGNOSIS — C44722 Squamous cell carcinoma of skin of right lower limb, including hip: Secondary | ICD-10-CM | POA: Diagnosis not present

## 2022-10-02 DIAGNOSIS — L814 Other melanin hyperpigmentation: Secondary | ICD-10-CM | POA: Diagnosis not present

## 2022-10-05 DIAGNOSIS — R944 Abnormal results of kidney function studies: Secondary | ICD-10-CM | POA: Diagnosis not present

## 2022-10-05 DIAGNOSIS — M545 Low back pain, unspecified: Secondary | ICD-10-CM | POA: Diagnosis not present

## 2022-10-10 DIAGNOSIS — C44722 Squamous cell carcinoma of skin of right lower limb, including hip: Secondary | ICD-10-CM | POA: Diagnosis not present

## 2022-10-17 DIAGNOSIS — M542 Cervicalgia: Secondary | ICD-10-CM | POA: Diagnosis not present

## 2022-10-24 DIAGNOSIS — M8588 Other specified disorders of bone density and structure, other site: Secondary | ICD-10-CM | POA: Diagnosis not present

## 2022-10-24 DIAGNOSIS — M48061 Spinal stenosis, lumbar region without neurogenic claudication: Secondary | ICD-10-CM | POA: Diagnosis not present

## 2022-10-24 DIAGNOSIS — M47896 Other spondylosis, lumbar region: Secondary | ICD-10-CM | POA: Diagnosis not present

## 2022-10-24 DIAGNOSIS — I700 Atherosclerosis of aorta: Secondary | ICD-10-CM | POA: Diagnosis not present

## 2022-10-24 DIAGNOSIS — R102 Pelvic and perineal pain: Secondary | ICD-10-CM | POA: Diagnosis not present

## 2022-10-24 DIAGNOSIS — N83202 Unspecified ovarian cyst, left side: Secondary | ICD-10-CM | POA: Diagnosis not present

## 2022-10-24 DIAGNOSIS — M4855XG Collapsed vertebra, not elsewhere classified, thoracolumbar region, subsequent encounter for fracture with delayed healing: Secondary | ICD-10-CM | POA: Diagnosis not present

## 2022-10-24 DIAGNOSIS — M545 Low back pain, unspecified: Secondary | ICD-10-CM | POA: Diagnosis not present

## 2022-11-02 DIAGNOSIS — Z515 Encounter for palliative care: Secondary | ICD-10-CM | POA: Diagnosis not present

## 2022-11-02 DIAGNOSIS — Z7982 Long term (current) use of aspirin: Secondary | ICD-10-CM | POA: Diagnosis not present

## 2022-11-02 DIAGNOSIS — Z9181 History of falling: Secondary | ICD-10-CM | POA: Diagnosis not present

## 2022-11-02 DIAGNOSIS — M1611 Unilateral primary osteoarthritis, right hip: Secondary | ICD-10-CM | POA: Diagnosis not present

## 2022-11-02 DIAGNOSIS — I509 Heart failure, unspecified: Secondary | ICD-10-CM | POA: Diagnosis not present

## 2022-11-02 DIAGNOSIS — E261 Secondary hyperaldosteronism: Secondary | ICD-10-CM | POA: Diagnosis not present

## 2022-11-02 DIAGNOSIS — I480 Paroxysmal atrial fibrillation: Secondary | ICD-10-CM | POA: Diagnosis not present

## 2022-11-02 DIAGNOSIS — D692 Other nonthrombocytopenic purpura: Secondary | ICD-10-CM | POA: Diagnosis not present

## 2022-11-02 DIAGNOSIS — Z6827 Body mass index (BMI) 27.0-27.9, adult: Secondary | ICD-10-CM | POA: Diagnosis not present

## 2022-11-02 DIAGNOSIS — D6869 Other thrombophilia: Secondary | ICD-10-CM | POA: Diagnosis not present

## 2022-11-15 DIAGNOSIS — S80811A Abrasion, right lower leg, initial encounter: Secondary | ICD-10-CM | POA: Diagnosis not present

## 2022-11-15 DIAGNOSIS — W19XXXA Unspecified fall, initial encounter: Secondary | ICD-10-CM | POA: Diagnosis not present

## 2022-11-15 DIAGNOSIS — S60811A Abrasion of right wrist, initial encounter: Secondary | ICD-10-CM | POA: Diagnosis not present

## 2022-11-15 DIAGNOSIS — S8001XA Contusion of right knee, initial encounter: Secondary | ICD-10-CM | POA: Diagnosis not present

## 2022-11-15 DIAGNOSIS — S90512A Abrasion, left ankle, initial encounter: Secondary | ICD-10-CM | POA: Diagnosis not present

## 2022-12-18 DIAGNOSIS — R293 Abnormal posture: Secondary | ICD-10-CM | POA: Diagnosis not present

## 2022-12-18 DIAGNOSIS — S32000D Wedge compression fracture of unspecified lumbar vertebra, subsequent encounter for fracture with routine healing: Secondary | ICD-10-CM | POA: Diagnosis not present

## 2022-12-18 DIAGNOSIS — M542 Cervicalgia: Secondary | ICD-10-CM | POA: Diagnosis not present

## 2022-12-25 DIAGNOSIS — Z792 Long term (current) use of antibiotics: Secondary | ICD-10-CM | POA: Diagnosis not present

## 2022-12-25 DIAGNOSIS — M1990 Unspecified osteoarthritis, unspecified site: Secondary | ICD-10-CM | POA: Diagnosis not present

## 2022-12-25 DIAGNOSIS — Z7409 Other reduced mobility: Secondary | ICD-10-CM | POA: Diagnosis not present

## 2022-12-25 DIAGNOSIS — J449 Chronic obstructive pulmonary disease, unspecified: Secondary | ICD-10-CM | POA: Diagnosis not present

## 2022-12-25 DIAGNOSIS — J323 Chronic sphenoidal sinusitis: Secondary | ICD-10-CM | POA: Diagnosis not present

## 2022-12-25 DIAGNOSIS — R9431 Abnormal electrocardiogram [ECG] [EKG]: Secondary | ICD-10-CM | POA: Diagnosis not present

## 2022-12-25 DIAGNOSIS — J440 Chronic obstructive pulmonary disease with acute lower respiratory infection: Secondary | ICD-10-CM | POA: Diagnosis not present

## 2022-12-25 DIAGNOSIS — Z79891 Long term (current) use of opiate analgesic: Secondary | ICD-10-CM | POA: Diagnosis not present

## 2022-12-25 DIAGNOSIS — J1282 Pneumonia due to coronavirus disease 2019: Secondary | ICD-10-CM | POA: Diagnosis not present

## 2022-12-25 DIAGNOSIS — J984 Other disorders of lung: Secondary | ICD-10-CM | POA: Diagnosis not present

## 2022-12-25 DIAGNOSIS — J189 Pneumonia, unspecified organism: Secondary | ICD-10-CM | POA: Diagnosis not present

## 2022-12-25 DIAGNOSIS — R0902 Hypoxemia: Secondary | ICD-10-CM | POA: Diagnosis not present

## 2022-12-25 DIAGNOSIS — Z4789 Encounter for other orthopedic aftercare: Secondary | ICD-10-CM | POA: Diagnosis not present

## 2022-12-25 DIAGNOSIS — K219 Gastro-esophageal reflux disease without esophagitis: Secondary | ICD-10-CM | POA: Diagnosis not present

## 2022-12-25 DIAGNOSIS — G319 Degenerative disease of nervous system, unspecified: Secondary | ICD-10-CM | POA: Diagnosis not present

## 2022-12-25 DIAGNOSIS — I480 Paroxysmal atrial fibrillation: Secondary | ICD-10-CM | POA: Diagnosis not present

## 2022-12-25 DIAGNOSIS — I700 Atherosclerosis of aorta: Secondary | ICD-10-CM | POA: Diagnosis not present

## 2022-12-25 DIAGNOSIS — I509 Heart failure, unspecified: Secondary | ICD-10-CM | POA: Diagnosis not present

## 2022-12-25 DIAGNOSIS — I719 Aortic aneurysm of unspecified site, without rupture: Secondary | ICD-10-CM | POA: Diagnosis not present

## 2022-12-25 DIAGNOSIS — Z7982 Long term (current) use of aspirin: Secondary | ICD-10-CM | POA: Diagnosis not present

## 2022-12-25 DIAGNOSIS — R54 Age-related physical debility: Secondary | ICD-10-CM | POA: Diagnosis not present

## 2022-12-25 DIAGNOSIS — R051 Acute cough: Secondary | ICD-10-CM | POA: Diagnosis not present

## 2022-12-25 DIAGNOSIS — R0600 Dyspnea, unspecified: Secondary | ICD-10-CM | POA: Diagnosis not present

## 2022-12-25 DIAGNOSIS — R0981 Nasal congestion: Secondary | ICD-10-CM | POA: Diagnosis not present

## 2022-12-25 DIAGNOSIS — Z79899 Other long term (current) drug therapy: Secondary | ICD-10-CM | POA: Diagnosis not present

## 2022-12-25 DIAGNOSIS — K2100 Gastro-esophageal reflux disease with esophagitis, without bleeding: Secondary | ICD-10-CM | POA: Diagnosis not present

## 2022-12-25 DIAGNOSIS — M1712 Unilateral primary osteoarthritis, left knee: Secondary | ICD-10-CM | POA: Diagnosis not present

## 2022-12-25 DIAGNOSIS — D6489 Other specified anemias: Secondary | ICD-10-CM | POA: Diagnosis not present

## 2022-12-25 DIAGNOSIS — I361 Nonrheumatic tricuspid (valve) insufficiency: Secondary | ICD-10-CM | POA: Diagnosis not present

## 2022-12-25 DIAGNOSIS — I517 Cardiomegaly: Secondary | ICD-10-CM | POA: Diagnosis not present

## 2022-12-25 DIAGNOSIS — W19XXXA Unspecified fall, initial encounter: Secondary | ICD-10-CM | POA: Diagnosis not present

## 2022-12-25 DIAGNOSIS — I9581 Postprocedural hypotension: Secondary | ICD-10-CM | POA: Diagnosis not present

## 2022-12-25 DIAGNOSIS — I340 Nonrheumatic mitral (valve) insufficiency: Secondary | ICD-10-CM | POA: Diagnosis not present

## 2022-12-25 DIAGNOSIS — R262 Difficulty in walking, not elsewhere classified: Secondary | ICD-10-CM | POA: Diagnosis not present

## 2022-12-25 DIAGNOSIS — E7800 Pure hypercholesterolemia, unspecified: Secondary | ICD-10-CM | POA: Diagnosis not present

## 2022-12-25 DIAGNOSIS — S2242XA Multiple fractures of ribs, left side, initial encounter for closed fracture: Secondary | ICD-10-CM | POA: Diagnosis not present

## 2022-12-25 DIAGNOSIS — M6259 Muscle wasting and atrophy, not elsewhere classified, multiple sites: Secondary | ICD-10-CM | POA: Diagnosis not present

## 2022-12-25 DIAGNOSIS — Z9889 Other specified postprocedural states: Secondary | ICD-10-CM | POA: Diagnosis not present

## 2022-12-25 DIAGNOSIS — Z880 Allergy status to penicillin: Secondary | ICD-10-CM | POA: Diagnosis not present

## 2022-12-25 DIAGNOSIS — M25552 Pain in left hip: Secondary | ICD-10-CM | POA: Diagnosis not present

## 2022-12-25 DIAGNOSIS — M810 Age-related osteoporosis without current pathological fracture: Secondary | ICD-10-CM | POA: Diagnosis not present

## 2022-12-25 DIAGNOSIS — J9811 Atelectasis: Secondary | ICD-10-CM | POA: Diagnosis not present

## 2022-12-25 DIAGNOSIS — R102 Pelvic and perineal pain: Secondary | ICD-10-CM | POA: Diagnosis not present

## 2022-12-25 DIAGNOSIS — S72002A Fracture of unspecified part of neck of left femur, initial encounter for closed fracture: Secondary | ICD-10-CM | POA: Diagnosis not present

## 2022-12-25 DIAGNOSIS — Z8711 Personal history of peptic ulcer disease: Secondary | ICD-10-CM | POA: Diagnosis not present

## 2022-12-25 DIAGNOSIS — R531 Weakness: Secondary | ICD-10-CM | POA: Diagnosis not present

## 2022-12-25 DIAGNOSIS — R42 Dizziness and giddiness: Secondary | ICD-10-CM | POA: Diagnosis not present

## 2022-12-25 DIAGNOSIS — R519 Headache, unspecified: Secondary | ICD-10-CM | POA: Diagnosis not present

## 2022-12-25 DIAGNOSIS — U071 COVID-19: Secondary | ICD-10-CM | POA: Diagnosis not present

## 2022-12-25 DIAGNOSIS — G4733 Obstructive sleep apnea (adult) (pediatric): Secondary | ICD-10-CM | POA: Diagnosis not present

## 2022-12-25 DIAGNOSIS — Z01810 Encounter for preprocedural cardiovascular examination: Secondary | ICD-10-CM | POA: Diagnosis not present

## 2022-12-25 DIAGNOSIS — S72142A Displaced intertrochanteric fracture of left femur, initial encounter for closed fracture: Secondary | ICD-10-CM | POA: Diagnosis not present

## 2022-12-25 DIAGNOSIS — S72142D Displaced intertrochanteric fracture of left femur, subsequent encounter for closed fracture with routine healing: Secondary | ICD-10-CM | POA: Diagnosis not present

## 2022-12-25 DIAGNOSIS — J9601 Acute respiratory failure with hypoxia: Secondary | ICD-10-CM | POA: Diagnosis not present

## 2022-12-25 DIAGNOSIS — Z7401 Bed confinement status: Secondary | ICD-10-CM | POA: Diagnosis not present

## 2022-12-25 DIAGNOSIS — Z950 Presence of cardiac pacemaker: Secondary | ICD-10-CM | POA: Diagnosis not present

## 2022-12-25 DIAGNOSIS — F419 Anxiety disorder, unspecified: Secondary | ICD-10-CM | POA: Diagnosis not present

## 2022-12-25 DIAGNOSIS — Z66 Do not resuscitate: Secondary | ICD-10-CM | POA: Diagnosis not present

## 2022-12-25 DIAGNOSIS — F32A Depression, unspecified: Secondary | ICD-10-CM | POA: Diagnosis not present

## 2022-12-25 DIAGNOSIS — I4510 Unspecified right bundle-branch block: Secondary | ICD-10-CM | POA: Diagnosis not present

## 2022-12-26 DIAGNOSIS — I340 Nonrheumatic mitral (valve) insufficiency: Secondary | ICD-10-CM | POA: Diagnosis not present

## 2022-12-26 DIAGNOSIS — J189 Pneumonia, unspecified organism: Secondary | ICD-10-CM | POA: Diagnosis not present

## 2022-12-26 DIAGNOSIS — Z950 Presence of cardiac pacemaker: Secondary | ICD-10-CM | POA: Diagnosis not present

## 2022-12-26 DIAGNOSIS — Z9889 Other specified postprocedural states: Secondary | ICD-10-CM | POA: Diagnosis not present

## 2022-12-26 DIAGNOSIS — S72142A Displaced intertrochanteric fracture of left femur, initial encounter for closed fracture: Secondary | ICD-10-CM | POA: Diagnosis not present

## 2022-12-26 DIAGNOSIS — I361 Nonrheumatic tricuspid (valve) insufficiency: Secondary | ICD-10-CM | POA: Diagnosis not present

## 2022-12-26 DIAGNOSIS — W19XXXA Unspecified fall, initial encounter: Secondary | ICD-10-CM | POA: Diagnosis not present

## 2022-12-26 DIAGNOSIS — Z01810 Encounter for preprocedural cardiovascular examination: Secondary | ICD-10-CM | POA: Diagnosis not present

## 2022-12-26 DIAGNOSIS — I4510 Unspecified right bundle-branch block: Secondary | ICD-10-CM | POA: Diagnosis not present

## 2022-12-26 DIAGNOSIS — I480 Paroxysmal atrial fibrillation: Secondary | ICD-10-CM | POA: Diagnosis not present

## 2022-12-29 DIAGNOSIS — F419 Anxiety disorder, unspecified: Secondary | ICD-10-CM | POA: Diagnosis not present

## 2022-12-29 DIAGNOSIS — M6281 Muscle weakness (generalized): Secondary | ICD-10-CM | POA: Diagnosis not present

## 2022-12-29 DIAGNOSIS — G4733 Obstructive sleep apnea (adult) (pediatric): Secondary | ICD-10-CM | POA: Diagnosis not present

## 2022-12-29 DIAGNOSIS — R110 Nausea: Secondary | ICD-10-CM | POA: Diagnosis not present

## 2022-12-29 DIAGNOSIS — R109 Unspecified abdominal pain: Secondary | ICD-10-CM | POA: Diagnosis not present

## 2022-12-29 DIAGNOSIS — M6259 Muscle wasting and atrophy, not elsewhere classified, multiple sites: Secondary | ICD-10-CM | POA: Diagnosis not present

## 2022-12-29 DIAGNOSIS — I509 Heart failure, unspecified: Secondary | ICD-10-CM | POA: Diagnosis not present

## 2022-12-29 DIAGNOSIS — I700 Atherosclerosis of aorta: Secondary | ICD-10-CM | POA: Diagnosis not present

## 2022-12-29 DIAGNOSIS — S72142A Displaced intertrochanteric fracture of left femur, initial encounter for closed fracture: Secondary | ICD-10-CM | POA: Diagnosis not present

## 2022-12-29 DIAGNOSIS — K5900 Constipation, unspecified: Secondary | ICD-10-CM | POA: Diagnosis not present

## 2022-12-29 DIAGNOSIS — I480 Paroxysmal atrial fibrillation: Secondary | ICD-10-CM | POA: Diagnosis not present

## 2022-12-29 DIAGNOSIS — R635 Abnormal weight gain: Secondary | ICD-10-CM | POA: Diagnosis not present

## 2022-12-29 DIAGNOSIS — Z4889 Encounter for other specified surgical aftercare: Secondary | ICD-10-CM | POA: Diagnosis not present

## 2022-12-29 DIAGNOSIS — Z7409 Other reduced mobility: Secondary | ICD-10-CM | POA: Diagnosis not present

## 2022-12-29 DIAGNOSIS — I5030 Unspecified diastolic (congestive) heart failure: Secondary | ICD-10-CM | POA: Diagnosis not present

## 2022-12-29 DIAGNOSIS — K2100 Gastro-esophageal reflux disease with esophagitis, without bleeding: Secondary | ICD-10-CM | POA: Diagnosis not present

## 2022-12-29 DIAGNOSIS — S72142S Displaced intertrochanteric fracture of left femur, sequela: Secondary | ICD-10-CM | POA: Diagnosis not present

## 2022-12-29 DIAGNOSIS — Z4789 Encounter for other orthopedic aftercare: Secondary | ICD-10-CM | POA: Diagnosis not present

## 2022-12-29 DIAGNOSIS — J1282 Pneumonia due to coronavirus disease 2019: Secondary | ICD-10-CM | POA: Diagnosis not present

## 2022-12-29 DIAGNOSIS — F32A Depression, unspecified: Secondary | ICD-10-CM | POA: Diagnosis not present

## 2022-12-29 DIAGNOSIS — R531 Weakness: Secondary | ICD-10-CM | POA: Diagnosis not present

## 2022-12-29 DIAGNOSIS — Z950 Presence of cardiac pacemaker: Secondary | ICD-10-CM | POA: Diagnosis not present

## 2022-12-29 DIAGNOSIS — I495 Sick sinus syndrome: Secondary | ICD-10-CM | POA: Diagnosis not present

## 2022-12-29 DIAGNOSIS — S72142D Displaced intertrochanteric fracture of left femur, subsequent encounter for closed fracture with routine healing: Secondary | ICD-10-CM | POA: Diagnosis not present

## 2022-12-29 DIAGNOSIS — R262 Difficulty in walking, not elsewhere classified: Secondary | ICD-10-CM | POA: Diagnosis not present

## 2022-12-29 DIAGNOSIS — I719 Aortic aneurysm of unspecified site, without rupture: Secondary | ICD-10-CM | POA: Diagnosis not present

## 2022-12-29 DIAGNOSIS — J9601 Acute respiratory failure with hypoxia: Secondary | ICD-10-CM | POA: Diagnosis not present

## 2022-12-29 DIAGNOSIS — R300 Dysuria: Secondary | ICD-10-CM | POA: Diagnosis not present

## 2022-12-29 DIAGNOSIS — R279 Unspecified lack of coordination: Secondary | ICD-10-CM | POA: Diagnosis not present

## 2022-12-29 DIAGNOSIS — R239 Unspecified skin changes: Secondary | ICD-10-CM | POA: Diagnosis not present

## 2022-12-29 DIAGNOSIS — K219 Gastro-esophageal reflux disease without esophagitis: Secondary | ICD-10-CM | POA: Diagnosis not present

## 2022-12-29 DIAGNOSIS — Z7401 Bed confinement status: Secondary | ICD-10-CM | POA: Diagnosis not present

## 2022-12-29 DIAGNOSIS — M810 Age-related osteoporosis without current pathological fracture: Secondary | ICD-10-CM | POA: Diagnosis not present

## 2022-12-29 DIAGNOSIS — M7732 Calcaneal spur, left foot: Secondary | ICD-10-CM | POA: Diagnosis not present

## 2022-12-29 DIAGNOSIS — I4719 Other supraventricular tachycardia: Secondary | ICD-10-CM | POA: Diagnosis not present

## 2022-12-29 DIAGNOSIS — J449 Chronic obstructive pulmonary disease, unspecified: Secondary | ICD-10-CM | POA: Diagnosis not present

## 2022-12-29 DIAGNOSIS — R102 Pelvic and perineal pain: Secondary | ICD-10-CM | POA: Diagnosis not present

## 2022-12-29 DIAGNOSIS — U071 COVID-19: Secondary | ICD-10-CM | POA: Diagnosis not present

## 2023-01-01 DIAGNOSIS — Z4889 Encounter for other specified surgical aftercare: Secondary | ICD-10-CM | POA: Diagnosis not present

## 2023-01-01 DIAGNOSIS — J449 Chronic obstructive pulmonary disease, unspecified: Secondary | ICD-10-CM | POA: Diagnosis not present

## 2023-01-01 DIAGNOSIS — I719 Aortic aneurysm of unspecified site, without rupture: Secondary | ICD-10-CM | POA: Diagnosis not present

## 2023-01-01 DIAGNOSIS — I480 Paroxysmal atrial fibrillation: Secondary | ICD-10-CM | POA: Diagnosis not present

## 2023-01-02 DIAGNOSIS — R110 Nausea: Secondary | ICD-10-CM | POA: Diagnosis not present

## 2023-01-02 DIAGNOSIS — K5900 Constipation, unspecified: Secondary | ICD-10-CM | POA: Diagnosis not present

## 2023-01-03 DIAGNOSIS — R635 Abnormal weight gain: Secondary | ICD-10-CM | POA: Diagnosis not present

## 2023-01-03 DIAGNOSIS — R110 Nausea: Secondary | ICD-10-CM | POA: Diagnosis not present

## 2023-01-03 DIAGNOSIS — K5900 Constipation, unspecified: Secondary | ICD-10-CM | POA: Diagnosis not present

## 2023-01-09 DIAGNOSIS — Z4889 Encounter for other specified surgical aftercare: Secondary | ICD-10-CM | POA: Diagnosis not present

## 2023-01-09 DIAGNOSIS — R262 Difficulty in walking, not elsewhere classified: Secondary | ICD-10-CM | POA: Diagnosis not present

## 2023-01-09 DIAGNOSIS — J449 Chronic obstructive pulmonary disease, unspecified: Secondary | ICD-10-CM | POA: Diagnosis not present

## 2023-01-09 DIAGNOSIS — K219 Gastro-esophageal reflux disease without esophagitis: Secondary | ICD-10-CM | POA: Diagnosis not present

## 2023-01-15 DIAGNOSIS — S72142S Displaced intertrochanteric fracture of left femur, sequela: Secondary | ICD-10-CM | POA: Diagnosis not present

## 2023-01-17 DIAGNOSIS — Z950 Presence of cardiac pacemaker: Secondary | ICD-10-CM | POA: Diagnosis not present

## 2023-01-17 DIAGNOSIS — I5030 Unspecified diastolic (congestive) heart failure: Secondary | ICD-10-CM | POA: Diagnosis not present

## 2023-01-17 DIAGNOSIS — I480 Paroxysmal atrial fibrillation: Secondary | ICD-10-CM | POA: Diagnosis not present

## 2023-01-17 DIAGNOSIS — J449 Chronic obstructive pulmonary disease, unspecified: Secondary | ICD-10-CM | POA: Diagnosis not present

## 2023-01-17 DIAGNOSIS — R300 Dysuria: Secondary | ICD-10-CM | POA: Diagnosis not present

## 2023-01-17 DIAGNOSIS — G4733 Obstructive sleep apnea (adult) (pediatric): Secondary | ICD-10-CM | POA: Diagnosis not present

## 2023-01-17 DIAGNOSIS — I4719 Other supraventricular tachycardia: Secondary | ICD-10-CM | POA: Diagnosis not present

## 2023-01-17 DIAGNOSIS — I495 Sick sinus syndrome: Secondary | ICD-10-CM | POA: Diagnosis not present

## 2023-01-24 DIAGNOSIS — R239 Unspecified skin changes: Secondary | ICD-10-CM | POA: Diagnosis not present

## 2023-01-24 DIAGNOSIS — M7732 Calcaneal spur, left foot: Secondary | ICD-10-CM | POA: Diagnosis not present

## 2023-01-31 DIAGNOSIS — R300 Dysuria: Secondary | ICD-10-CM | POA: Diagnosis not present

## 2023-01-31 DIAGNOSIS — I480 Paroxysmal atrial fibrillation: Secondary | ICD-10-CM | POA: Diagnosis not present

## 2023-01-31 DIAGNOSIS — R262 Difficulty in walking, not elsewhere classified: Secondary | ICD-10-CM | POA: Diagnosis not present

## 2023-01-31 DIAGNOSIS — J449 Chronic obstructive pulmonary disease, unspecified: Secondary | ICD-10-CM | POA: Diagnosis not present

## 2023-02-01 DIAGNOSIS — Z4789 Encounter for other orthopedic aftercare: Secondary | ICD-10-CM | POA: Diagnosis not present

## 2023-02-01 DIAGNOSIS — R279 Unspecified lack of coordination: Secondary | ICD-10-CM | POA: Diagnosis not present

## 2023-02-01 DIAGNOSIS — J449 Chronic obstructive pulmonary disease, unspecified: Secondary | ICD-10-CM | POA: Diagnosis not present

## 2023-02-01 DIAGNOSIS — S72142D Displaced intertrochanteric fracture of left femur, subsequent encounter for closed fracture with routine healing: Secondary | ICD-10-CM | POA: Diagnosis not present

## 2023-02-01 DIAGNOSIS — M6281 Muscle weakness (generalized): Secondary | ICD-10-CM | POA: Diagnosis not present

## 2023-02-05 DIAGNOSIS — R300 Dysuria: Secondary | ICD-10-CM | POA: Diagnosis not present

## 2023-02-05 DIAGNOSIS — N3091 Cystitis, unspecified with hematuria: Secondary | ICD-10-CM | POA: Diagnosis not present

## 2023-02-07 DIAGNOSIS — D649 Anemia, unspecified: Secondary | ICD-10-CM | POA: Diagnosis not present

## 2023-02-07 DIAGNOSIS — S72142D Displaced intertrochanteric fracture of left femur, subsequent encounter for closed fracture with routine healing: Secondary | ICD-10-CM | POA: Diagnosis not present

## 2023-02-07 DIAGNOSIS — M7732 Calcaneal spur, left foot: Secondary | ICD-10-CM | POA: Diagnosis not present

## 2023-02-07 DIAGNOSIS — Z950 Presence of cardiac pacemaker: Secondary | ICD-10-CM | POA: Diagnosis not present

## 2023-02-07 DIAGNOSIS — L539 Erythematous condition, unspecified: Secondary | ICD-10-CM | POA: Diagnosis not present

## 2023-02-07 DIAGNOSIS — F419 Anxiety disorder, unspecified: Secondary | ICD-10-CM | POA: Diagnosis not present

## 2023-02-07 DIAGNOSIS — Z9181 History of falling: Secondary | ICD-10-CM | POA: Diagnosis not present

## 2023-02-07 DIAGNOSIS — G4733 Obstructive sleep apnea (adult) (pediatric): Secondary | ICD-10-CM | POA: Diagnosis not present

## 2023-02-07 DIAGNOSIS — R635 Abnormal weight gain: Secondary | ICD-10-CM | POA: Diagnosis not present

## 2023-02-07 DIAGNOSIS — R300 Dysuria: Secondary | ICD-10-CM | POA: Diagnosis not present

## 2023-02-07 DIAGNOSIS — I480 Paroxysmal atrial fibrillation: Secondary | ICD-10-CM | POA: Diagnosis not present

## 2023-02-07 DIAGNOSIS — Z87440 Personal history of urinary (tract) infections: Secondary | ICD-10-CM | POA: Diagnosis not present

## 2023-02-07 DIAGNOSIS — I959 Hypotension, unspecified: Secondary | ICD-10-CM | POA: Diagnosis not present

## 2023-02-07 DIAGNOSIS — Z8616 Personal history of COVID-19: Secondary | ICD-10-CM | POA: Diagnosis not present

## 2023-02-07 DIAGNOSIS — F32A Depression, unspecified: Secondary | ICD-10-CM | POA: Diagnosis not present

## 2023-02-07 DIAGNOSIS — Z7951 Long term (current) use of inhaled steroids: Secondary | ICD-10-CM | POA: Diagnosis not present

## 2023-02-07 DIAGNOSIS — R32 Unspecified urinary incontinence: Secondary | ICD-10-CM | POA: Diagnosis not present

## 2023-02-07 DIAGNOSIS — H9193 Unspecified hearing loss, bilateral: Secondary | ICD-10-CM | POA: Diagnosis not present

## 2023-02-07 DIAGNOSIS — I700 Atherosclerosis of aorta: Secondary | ICD-10-CM | POA: Diagnosis not present

## 2023-02-07 DIAGNOSIS — Z8701 Personal history of pneumonia (recurrent): Secondary | ICD-10-CM | POA: Diagnosis not present

## 2023-02-07 DIAGNOSIS — K219 Gastro-esophageal reflux disease without esophagitis: Secondary | ICD-10-CM | POA: Diagnosis not present

## 2023-02-07 DIAGNOSIS — Z7982 Long term (current) use of aspirin: Secondary | ICD-10-CM | POA: Diagnosis not present

## 2023-02-07 DIAGNOSIS — K5900 Constipation, unspecified: Secondary | ICD-10-CM | POA: Diagnosis not present

## 2023-02-07 DIAGNOSIS — J449 Chronic obstructive pulmonary disease, unspecified: Secondary | ICD-10-CM | POA: Diagnosis not present

## 2023-02-12 DIAGNOSIS — S72142S Displaced intertrochanteric fracture of left femur, sequela: Secondary | ICD-10-CM | POA: Diagnosis not present

## 2023-02-13 DIAGNOSIS — S72142D Displaced intertrochanteric fracture of left femur, subsequent encounter for closed fracture with routine healing: Secondary | ICD-10-CM | POA: Diagnosis not present

## 2023-02-13 DIAGNOSIS — D692 Other nonthrombocytopenic purpura: Secondary | ICD-10-CM | POA: Diagnosis not present

## 2023-02-15 DIAGNOSIS — K219 Gastro-esophageal reflux disease without esophagitis: Secondary | ICD-10-CM | POA: Diagnosis not present

## 2023-02-15 DIAGNOSIS — Z1331 Encounter for screening for depression: Secondary | ICD-10-CM | POA: Diagnosis not present

## 2023-02-15 DIAGNOSIS — R300 Dysuria: Secondary | ICD-10-CM | POA: Diagnosis not present

## 2023-02-15 DIAGNOSIS — I4891 Unspecified atrial fibrillation: Secondary | ICD-10-CM | POA: Diagnosis not present

## 2023-02-15 DIAGNOSIS — J449 Chronic obstructive pulmonary disease, unspecified: Secondary | ICD-10-CM | POA: Diagnosis not present

## 2023-02-15 DIAGNOSIS — Z6830 Body mass index (BMI) 30.0-30.9, adult: Secondary | ICD-10-CM | POA: Diagnosis not present

## 2023-02-15 DIAGNOSIS — Z0000 Encounter for general adult medical examination without abnormal findings: Secondary | ICD-10-CM | POA: Diagnosis not present

## 2023-02-15 DIAGNOSIS — F32A Depression, unspecified: Secondary | ICD-10-CM | POA: Diagnosis not present

## 2023-02-15 DIAGNOSIS — Z23 Encounter for immunization: Secondary | ICD-10-CM | POA: Diagnosis not present

## 2023-02-15 DIAGNOSIS — L609 Nail disorder, unspecified: Secondary | ICD-10-CM | POA: Diagnosis not present

## 2023-02-15 DIAGNOSIS — Z1339 Encounter for screening examination for other mental health and behavioral disorders: Secondary | ICD-10-CM | POA: Diagnosis not present

## 2023-02-19 DIAGNOSIS — I080 Rheumatic disorders of both mitral and aortic valves: Secondary | ICD-10-CM | POA: Diagnosis not present

## 2023-02-19 DIAGNOSIS — Z950 Presence of cardiac pacemaker: Secondary | ICD-10-CM | POA: Diagnosis not present

## 2023-03-07 DIAGNOSIS — R0602 Shortness of breath: Secondary | ICD-10-CM | POA: Diagnosis not present

## 2023-03-07 DIAGNOSIS — Z950 Presence of cardiac pacemaker: Secondary | ICD-10-CM | POA: Diagnosis not present

## 2023-03-07 DIAGNOSIS — I480 Paroxysmal atrial fibrillation: Secondary | ICD-10-CM | POA: Diagnosis not present

## 2023-03-07 DIAGNOSIS — I4719 Other supraventricular tachycardia: Secondary | ICD-10-CM | POA: Diagnosis not present

## 2023-03-07 DIAGNOSIS — I495 Sick sinus syndrome: Secondary | ICD-10-CM | POA: Diagnosis not present

## 2023-03-07 DIAGNOSIS — I5030 Unspecified diastolic (congestive) heart failure: Secondary | ICD-10-CM | POA: Diagnosis not present

## 2023-03-07 DIAGNOSIS — I4729 Other ventricular tachycardia: Secondary | ICD-10-CM | POA: Diagnosis not present

## 2023-03-20 DIAGNOSIS — Z45010 Encounter for checking and testing of cardiac pacemaker pulse generator [battery]: Secondary | ICD-10-CM | POA: Diagnosis not present

## 2023-03-20 DIAGNOSIS — B353 Tinea pedis: Secondary | ICD-10-CM | POA: Diagnosis not present

## 2023-03-20 DIAGNOSIS — R0989 Other specified symptoms and signs involving the circulatory and respiratory systems: Secondary | ICD-10-CM | POA: Diagnosis not present

## 2023-03-24 DIAGNOSIS — R21 Rash and other nonspecific skin eruption: Secondary | ICD-10-CM | POA: Diagnosis not present

## 2023-03-26 DIAGNOSIS — Z9181 History of falling: Secondary | ICD-10-CM | POA: Diagnosis not present

## 2023-03-26 DIAGNOSIS — Z45010 Encounter for checking and testing of cardiac pacemaker pulse generator [battery]: Secondary | ICD-10-CM | POA: Diagnosis not present

## 2023-03-26 DIAGNOSIS — S72142S Displaced intertrochanteric fracture of left femur, sequela: Secondary | ICD-10-CM | POA: Diagnosis not present

## 2023-03-26 DIAGNOSIS — Z515 Encounter for palliative care: Secondary | ICD-10-CM | POA: Diagnosis not present

## 2023-03-26 DIAGNOSIS — M1712 Unilateral primary osteoarthritis, left knee: Secondary | ICD-10-CM | POA: Diagnosis not present

## 2023-03-26 DIAGNOSIS — S72002D Fracture of unspecified part of neck of left femur, subsequent encounter for closed fracture with routine healing: Secondary | ICD-10-CM | POA: Diagnosis not present

## 2023-03-28 DIAGNOSIS — L300 Nummular dermatitis: Secondary | ICD-10-CM | POA: Diagnosis not present

## 2023-03-28 DIAGNOSIS — L821 Other seborrheic keratosis: Secondary | ICD-10-CM | POA: Diagnosis not present

## 2023-04-12 DIAGNOSIS — L304 Erythema intertrigo: Secondary | ICD-10-CM | POA: Diagnosis not present

## 2023-04-12 DIAGNOSIS — B351 Tinea unguium: Secondary | ICD-10-CM | POA: Diagnosis not present

## 2023-04-17 DIAGNOSIS — I495 Sick sinus syndrome: Secondary | ICD-10-CM | POA: Diagnosis not present

## 2023-04-17 DIAGNOSIS — J449 Chronic obstructive pulmonary disease, unspecified: Secondary | ICD-10-CM | POA: Diagnosis not present

## 2023-04-17 DIAGNOSIS — G4733 Obstructive sleep apnea (adult) (pediatric): Secondary | ICD-10-CM | POA: Diagnosis not present

## 2023-04-17 DIAGNOSIS — I4729 Other ventricular tachycardia: Secondary | ICD-10-CM | POA: Diagnosis not present

## 2023-04-17 DIAGNOSIS — I480 Paroxysmal atrial fibrillation: Secondary | ICD-10-CM | POA: Diagnosis not present

## 2023-04-17 DIAGNOSIS — Z950 Presence of cardiac pacemaker: Secondary | ICD-10-CM | POA: Diagnosis not present

## 2023-04-17 DIAGNOSIS — I5042 Chronic combined systolic (congestive) and diastolic (congestive) heart failure: Secondary | ICD-10-CM | POA: Diagnosis not present

## 2023-04-17 DIAGNOSIS — I4719 Other supraventricular tachycardia: Secondary | ICD-10-CM | POA: Diagnosis not present

## 2023-04-26 DIAGNOSIS — I4891 Unspecified atrial fibrillation: Secondary | ICD-10-CM | POA: Diagnosis not present

## 2023-04-26 DIAGNOSIS — I739 Peripheral vascular disease, unspecified: Secondary | ICD-10-CM | POA: Diagnosis not present

## 2023-04-26 DIAGNOSIS — J449 Chronic obstructive pulmonary disease, unspecified: Secondary | ICD-10-CM | POA: Diagnosis not present

## 2023-04-26 DIAGNOSIS — E669 Obesity, unspecified: Secondary | ICD-10-CM | POA: Diagnosis not present

## 2023-05-27 DIAGNOSIS — R051 Acute cough: Secondary | ICD-10-CM | POA: Diagnosis not present

## 2023-05-27 DIAGNOSIS — R0981 Nasal congestion: Secondary | ICD-10-CM | POA: Diagnosis not present

## 2023-05-27 DIAGNOSIS — R0600 Dyspnea, unspecified: Secondary | ICD-10-CM | POA: Diagnosis not present

## 2023-05-31 DIAGNOSIS — R0981 Nasal congestion: Secondary | ICD-10-CM | POA: Diagnosis not present

## 2023-05-31 DIAGNOSIS — R0600 Dyspnea, unspecified: Secondary | ICD-10-CM | POA: Diagnosis not present

## 2023-05-31 DIAGNOSIS — R051 Acute cough: Secondary | ICD-10-CM | POA: Diagnosis not present

## 2023-07-04 DIAGNOSIS — G4733 Obstructive sleep apnea (adult) (pediatric): Secondary | ICD-10-CM | POA: Diagnosis not present

## 2023-09-12 DIAGNOSIS — H18513 Endothelial corneal dystrophy, bilateral: Secondary | ICD-10-CM | POA: Diagnosis not present

## 2023-09-19 DIAGNOSIS — I4719 Other supraventricular tachycardia: Secondary | ICD-10-CM | POA: Diagnosis not present

## 2023-09-19 DIAGNOSIS — Z4502 Encounter for adjustment and management of automatic implantable cardiac defibrillator: Secondary | ICD-10-CM | POA: Diagnosis not present

## 2023-09-19 DIAGNOSIS — I495 Sick sinus syndrome: Secondary | ICD-10-CM | POA: Diagnosis not present

## 2023-09-19 DIAGNOSIS — J449 Chronic obstructive pulmonary disease, unspecified: Secondary | ICD-10-CM | POA: Diagnosis not present

## 2023-09-19 DIAGNOSIS — I480 Paroxysmal atrial fibrillation: Secondary | ICD-10-CM | POA: Diagnosis not present

## 2023-09-19 DIAGNOSIS — Z95810 Presence of automatic (implantable) cardiac defibrillator: Secondary | ICD-10-CM | POA: Diagnosis not present

## 2023-09-19 DIAGNOSIS — G4733 Obstructive sleep apnea (adult) (pediatric): Secondary | ICD-10-CM | POA: Diagnosis not present

## 2023-09-30 DIAGNOSIS — Z45018 Encounter for adjustment and management of other part of cardiac pacemaker: Secondary | ICD-10-CM | POA: Diagnosis not present

## 2023-09-30 DIAGNOSIS — I4729 Other ventricular tachycardia: Secondary | ICD-10-CM | POA: Diagnosis not present

## 2023-10-04 DIAGNOSIS — I4729 Other ventricular tachycardia: Secondary | ICD-10-CM | POA: Diagnosis not present

## 2023-10-04 DIAGNOSIS — Z950 Presence of cardiac pacemaker: Secondary | ICD-10-CM | POA: Diagnosis not present

## 2023-10-04 DIAGNOSIS — Z95811 Presence of heart assist device: Secondary | ICD-10-CM | POA: Diagnosis not present

## 2023-10-04 DIAGNOSIS — I480 Paroxysmal atrial fibrillation: Secondary | ICD-10-CM | POA: Diagnosis not present

## 2023-10-04 DIAGNOSIS — I5030 Unspecified diastolic (congestive) heart failure: Secondary | ICD-10-CM | POA: Diagnosis not present

## 2023-11-13 DIAGNOSIS — R300 Dysuria: Secondary | ICD-10-CM | POA: Diagnosis not present

## 2023-11-13 DIAGNOSIS — N390 Urinary tract infection, site not specified: Secondary | ICD-10-CM | POA: Diagnosis not present

## 2023-11-19 DIAGNOSIS — R300 Dysuria: Secondary | ICD-10-CM | POA: Diagnosis not present

## 2023-11-19 DIAGNOSIS — R309 Painful micturition, unspecified: Secondary | ICD-10-CM | POA: Diagnosis not present

## 2023-11-19 DIAGNOSIS — R8281 Pyuria: Secondary | ICD-10-CM | POA: Diagnosis not present

## 2023-11-19 DIAGNOSIS — N3091 Cystitis, unspecified with hematuria: Secondary | ICD-10-CM | POA: Diagnosis not present

## 2023-11-27 DIAGNOSIS — R300 Dysuria: Secondary | ICD-10-CM | POA: Diagnosis not present

## 2023-12-17 DIAGNOSIS — M7062 Trochanteric bursitis, left hip: Secondary | ICD-10-CM | POA: Diagnosis not present

## 2023-12-17 DIAGNOSIS — S72142A Displaced intertrochanteric fracture of left femur, initial encounter for closed fracture: Secondary | ICD-10-CM | POA: Diagnosis not present

## 2024-01-14 DIAGNOSIS — S90121A Contusion of right lesser toe(s) without damage to nail, initial encounter: Secondary | ICD-10-CM | POA: Diagnosis not present

## 2024-01-14 DIAGNOSIS — M79671 Pain in right foot: Secondary | ICD-10-CM | POA: Diagnosis not present

## 2024-01-14 DIAGNOSIS — S90819A Abrasion, unspecified foot, initial encounter: Secondary | ICD-10-CM | POA: Diagnosis not present

## 2024-01-14 DIAGNOSIS — I739 Peripheral vascular disease, unspecified: Secondary | ICD-10-CM | POA: Diagnosis not present

## 2024-01-14 DIAGNOSIS — M2041 Other hammer toe(s) (acquired), right foot: Secondary | ICD-10-CM | POA: Diagnosis not present

## 2024-01-28 DIAGNOSIS — R300 Dysuria: Secondary | ICD-10-CM | POA: Diagnosis not present

## 2024-01-28 DIAGNOSIS — N3001 Acute cystitis with hematuria: Secondary | ICD-10-CM | POA: Diagnosis not present

## 2024-01-28 DIAGNOSIS — N3091 Cystitis, unspecified with hematuria: Secondary | ICD-10-CM | POA: Diagnosis not present

## 2024-01-29 DIAGNOSIS — M7062 Trochanteric bursitis, left hip: Secondary | ICD-10-CM | POA: Diagnosis not present

## 2024-01-29 DIAGNOSIS — S72142D Displaced intertrochanteric fracture of left femur, subsequent encounter for closed fracture with routine healing: Secondary | ICD-10-CM | POA: Diagnosis not present

## 2024-02-04 DIAGNOSIS — J449 Chronic obstructive pulmonary disease, unspecified: Secondary | ICD-10-CM | POA: Diagnosis not present

## 2024-02-04 DIAGNOSIS — Z950 Presence of cardiac pacemaker: Secondary | ICD-10-CM | POA: Diagnosis not present

## 2024-02-04 DIAGNOSIS — E785 Hyperlipidemia, unspecified: Secondary | ICD-10-CM | POA: Diagnosis not present

## 2024-02-04 DIAGNOSIS — B961 Klebsiella pneumoniae [K. pneumoniae] as the cause of diseases classified elsewhere: Secondary | ICD-10-CM | POA: Diagnosis not present

## 2024-02-04 DIAGNOSIS — N952 Postmenopausal atrophic vaginitis: Secondary | ICD-10-CM | POA: Diagnosis not present

## 2024-02-04 DIAGNOSIS — G4733 Obstructive sleep apnea (adult) (pediatric): Secondary | ICD-10-CM | POA: Diagnosis not present

## 2024-02-04 DIAGNOSIS — J342 Deviated nasal septum: Secondary | ICD-10-CM | POA: Diagnosis not present

## 2024-02-04 DIAGNOSIS — I495 Sick sinus syndrome: Secondary | ICD-10-CM | POA: Diagnosis not present

## 2024-02-04 DIAGNOSIS — H5440 Blindness, one eye, unspecified eye: Secondary | ICD-10-CM | POA: Diagnosis not present

## 2024-02-04 DIAGNOSIS — M47812 Spondylosis without myelopathy or radiculopathy, cervical region: Secondary | ICD-10-CM | POA: Diagnosis not present

## 2024-02-04 DIAGNOSIS — I739 Peripheral vascular disease, unspecified: Secondary | ICD-10-CM | POA: Diagnosis not present

## 2024-02-04 DIAGNOSIS — N390 Urinary tract infection, site not specified: Secondary | ICD-10-CM | POA: Diagnosis not present

## 2024-02-04 DIAGNOSIS — N3289 Other specified disorders of bladder: Secondary | ICD-10-CM | POA: Diagnosis not present

## 2024-02-04 DIAGNOSIS — M80052S Age-related osteoporosis with current pathological fracture, left femur, sequela: Secondary | ICD-10-CM | POA: Diagnosis not present

## 2024-02-04 DIAGNOSIS — M797 Fibromyalgia: Secondary | ICD-10-CM | POA: Diagnosis not present

## 2024-02-04 DIAGNOSIS — M2041 Other hammer toe(s) (acquired), right foot: Secondary | ICD-10-CM | POA: Diagnosis not present

## 2024-02-04 DIAGNOSIS — H903 Sensorineural hearing loss, bilateral: Secondary | ICD-10-CM | POA: Diagnosis not present

## 2024-02-04 DIAGNOSIS — I471 Supraventricular tachycardia, unspecified: Secondary | ICD-10-CM | POA: Diagnosis not present

## 2024-02-04 DIAGNOSIS — F32A Depression, unspecified: Secondary | ICD-10-CM | POA: Diagnosis not present

## 2024-02-04 DIAGNOSIS — M150 Primary generalized (osteo)arthritis: Secondary | ICD-10-CM | POA: Diagnosis not present

## 2024-02-04 DIAGNOSIS — G8929 Other chronic pain: Secondary | ICD-10-CM | POA: Diagnosis not present

## 2024-02-04 DIAGNOSIS — I480 Paroxysmal atrial fibrillation: Secondary | ICD-10-CM | POA: Diagnosis not present

## 2024-02-04 DIAGNOSIS — M545 Low back pain, unspecified: Secondary | ICD-10-CM | POA: Diagnosis not present

## 2024-02-14 DIAGNOSIS — I739 Peripheral vascular disease, unspecified: Secondary | ICD-10-CM | POA: Diagnosis not present

## 2024-02-14 DIAGNOSIS — I4891 Unspecified atrial fibrillation: Secondary | ICD-10-CM | POA: Diagnosis not present

## 2024-02-14 DIAGNOSIS — I495 Sick sinus syndrome: Secondary | ICD-10-CM | POA: Diagnosis not present

## 2024-02-18 DIAGNOSIS — Z23 Encounter for immunization: Secondary | ICD-10-CM | POA: Diagnosis not present

## 2024-02-18 DIAGNOSIS — Z6832 Body mass index (BMI) 32.0-32.9, adult: Secondary | ICD-10-CM | POA: Diagnosis not present

## 2024-02-18 DIAGNOSIS — J449 Chronic obstructive pulmonary disease, unspecified: Secondary | ICD-10-CM | POA: Diagnosis not present

## 2024-02-18 DIAGNOSIS — K219 Gastro-esophageal reflux disease without esophagitis: Secondary | ICD-10-CM | POA: Diagnosis not present

## 2024-02-18 DIAGNOSIS — F32A Depression, unspecified: Secondary | ICD-10-CM | POA: Diagnosis not present

## 2024-02-18 DIAGNOSIS — Z1331 Encounter for screening for depression: Secondary | ICD-10-CM | POA: Diagnosis not present

## 2024-02-18 DIAGNOSIS — I4891 Unspecified atrial fibrillation: Secondary | ICD-10-CM | POA: Diagnosis not present

## 2024-02-18 DIAGNOSIS — Z1339 Encounter for screening examination for other mental health and behavioral disorders: Secondary | ICD-10-CM | POA: Diagnosis not present

## 2024-02-18 DIAGNOSIS — R300 Dysuria: Secondary | ICD-10-CM | POA: Diagnosis not present

## 2024-02-18 DIAGNOSIS — Z0000 Encounter for general adult medical examination without abnormal findings: Secondary | ICD-10-CM | POA: Diagnosis not present

## 2024-03-05 DIAGNOSIS — R300 Dysuria: Secondary | ICD-10-CM | POA: Diagnosis not present

## 2024-03-05 DIAGNOSIS — N390 Urinary tract infection, site not specified: Secondary | ICD-10-CM | POA: Diagnosis not present

## 2024-03-18 DIAGNOSIS — Z950 Presence of cardiac pacemaker: Secondary | ICD-10-CM | POA: Diagnosis not present

## 2024-04-01 DIAGNOSIS — S72142D Displaced intertrochanteric fracture of left femur, subsequent encounter for closed fracture with routine healing: Secondary | ICD-10-CM | POA: Diagnosis not present

## 2024-04-24 DIAGNOSIS — I4891 Unspecified atrial fibrillation: Secondary | ICD-10-CM | POA: Diagnosis not present
